# Patient Record
Sex: Female | Born: 1970 | Race: White | Hispanic: No | Marital: Single | Smoking: Never smoker
Health system: Southern US, Community
[De-identification: ages and names within clinical notes are randomized; demographics above are authoritative.]

## PROBLEM LIST (undated history)

## (undated) DIAGNOSIS — K828 Other specified diseases of gallbladder: Secondary | ICD-10-CM

## (undated) DIAGNOSIS — D242 Benign neoplasm of left breast: Secondary | ICD-10-CM

## (undated) DIAGNOSIS — N6012 Diffuse cystic mastopathy of left breast: Secondary | ICD-10-CM

## (undated) DIAGNOSIS — R079 Chest pain, unspecified: Secondary | ICD-10-CM

## (undated) DIAGNOSIS — R1011 Right upper quadrant pain: Secondary | ICD-10-CM

## (undated) HISTORY — PX: TUBAL LIGATION: SHX77

## (undated) HISTORY — DX: Chest pain, unspecified: R07.9

## (undated) HISTORY — PX: APPENDECTOMY: SHX54

## (undated) HISTORY — DX: Diffuse cystic mastopathy of left breast: N60.12

## (undated) HISTORY — PX: BREAST SURGERY: SHX581

## (undated) HISTORY — DX: Right upper quadrant pain: R10.11

## (undated) HISTORY — PX: TONSILLECTOMY: SUR1361

## (undated) HISTORY — PX: FOOT SURGERY: SHX648

## (undated) HISTORY — PX: CHOLECYSTECTOMY: SHX55

## (undated) HISTORY — PX: WISDOM TOOTH EXTRACTION: SHX21

## (undated) HISTORY — PX: HERNIA REPAIR: SHX51

## (undated) HISTORY — DX: Benign neoplasm of left breast: D24.2

## (undated) HISTORY — DX: Other specified diseases of gallbladder: K82.8

## (undated) HISTORY — PX: TOTAL ABDOMINAL HYSTERECTOMY: SHX209

---

## 2015-08-11 ENCOUNTER — Other Ambulatory Visit (HOSPITAL_COMMUNITY): Payer: Self-pay | Admitting: Surgery

## 2015-08-11 DIAGNOSIS — R1011 Right upper quadrant pain: Secondary | ICD-10-CM

## 2015-08-14 ENCOUNTER — Ambulatory Visit (HOSPITAL_COMMUNITY)
Admission: RE | Admit: 2015-08-14 | Discharge: 2015-08-14 | Disposition: A | Discharge status: Home / Self Care | Payer: PRIVATE HEALTH INSURANCE | Source: Ambulatory Visit | Attending: Surgery | Admitting: Surgery

## 2015-08-14 DIAGNOSIS — R1011 Right upper quadrant pain: Secondary | ICD-10-CM | POA: Diagnosis present

## 2015-08-14 MED ORDER — TECHNETIUM TC 99M MEBROFENIN IV KIT
5.0000 | PACK | Freq: Once | INTRAVENOUS | Status: AC | PRN
  Administered 2015-08-14: 5 via INTRAVENOUS

## 2015-08-17 DIAGNOSIS — R1011 Right upper quadrant pain: Secondary | ICD-10-CM | POA: Insufficient documentation

## 2015-08-17 HISTORY — DX: Right upper quadrant pain: R10.11

## 2016-04-29 DIAGNOSIS — D242 Benign neoplasm of left breast: Secondary | ICD-10-CM | POA: Insufficient documentation

## 2016-04-29 HISTORY — DX: Benign neoplasm of left breast: D24.2

## 2017-06-10 DIAGNOSIS — N6012 Diffuse cystic mastopathy of left breast: Secondary | ICD-10-CM | POA: Insufficient documentation

## 2017-06-10 HISTORY — DX: Diffuse cystic mastopathy of left breast: N60.12

## 2017-07-25 DIAGNOSIS — K828 Other specified diseases of gallbladder: Secondary | ICD-10-CM

## 2017-07-25 HISTORY — DX: Other specified diseases of gallbladder: K82.8

## 2018-03-18 DIAGNOSIS — R079 Chest pain, unspecified: Secondary | ICD-10-CM

## 2018-03-18 HISTORY — DX: Chest pain, unspecified: R07.9

## 2018-03-18 NOTE — Progress Notes (Signed)
Cardiology Office Note:    Date:  03/19/2018   ID:  Mackenzie Burns, DOB 1970/03/20, MRN [id number]  PCP:  Philmore Pali, NP  Cardiologist:  Shirlee More, MD   Referring MD: Philmore Pali, NP  ASSESSMENT:    1. Pre-procedure lab exam   2. Chest pain in adult    PLAN:    In order of problems listed above:  1. She is having symptoms best described as atypical angina.  There is an element of chest wall tenderness I will start her on a nonsteroidal anti-inflammatory drug and will do expedited cardiac CTA.  Unfortunately she has a history of urticaria with sulfa-based drugs cross reaction with Celebrex we will withdraw the prescription and place her on Tylenol 2 tabs twice daily pending results cardiac CTA  Next appointment 6 weeks   Medication Adjustments/Labs and Tests Ordered: Current medicines are reviewed at length with the patient today.  Concerns regarding medicines are outlined above.  Orders Placed This Encounter  Procedures  . CT CORONARY MORPH W/CTA COR W/SCORE W/CA W/CM &/OR WO/CM  . CT CORONARY FRACTIONAL FLOW RESERVE DATA PREP  . CT CORONARY FRACTIONAL FLOW RESERVE FLUID ANALYSIS  . EKG 12-Lead   No orders of the defined types were placed in this encounter.    Chief Complaint  Patient presents with  . Chest Pain    History of Present Illness:    Mackenzie Burns is a 48 y.o. female who is being seen today for the evaluation of chest pain at the request of Lam, Rudi Rummage, NP.  Recently seen at [facility] ED with normal troponin, D Dimer and EKG with non psecific changes and discharged.  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  She has a background history of previous chest pain greater than 20 years ago and 5 years ago a normal stress test.  New Year's Day she had the onset at rest with severe crushing substernal pressure rating to left arm she said initially heart rate was greater than 150 but no arrhythmia was cautioned on an EKG she was seen in the emergency room 2 EKG  shows sinus rhythm nonspecific T waves troponin was undetectable.  D-dimer was low chest x-ray unremarkable and was discharged.  In the emergency room she received nitroglycerin without relief morphine and subsequently Dilaudid oral and IV with some relief but was discharged in the hospital persistent chest pain she has no associated GI symptoms she has had some intermittent mild shortness of breath but not significant she is continued working continues to have these episodes nonexertional unrelieved with rest substernal pressure lasting for up to 20 minutes.  Her cardiovascular risk profile is intermediate with age and mild hyperlipidemia and family history of CAD in his father.  She has no known history of congenital rheumatic heart disease.  She has no associated GI symptoms no chest wall trauma but she is tender along the costochondral junction this may represent costochondritis for further evaluation cardiac CTA to be performed expedited and will place her on a nonsteroidal anti-inflammatory drug.  Follow-up anticipated in the office in 6 weeks.  Past Medical History:  Diagnosis Date  . Biliary dyskinesia 07/25/2017   Added automatically from request for surgery [id number]  . Chest pain in adult 03/18/2018  . Fibrocystic breast changes, left 06/10/2017  . 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 Right upper quadrant abdominal pain 08/17/2015    Past Surgical History:  Procedure Laterality Date  . APPENDECTOMY    .  BREAST SURGERY     papaloma removed  . CHOLECYSTECTOMY    . FOOT SURGERY    . HERNIA REPAIR    . TONSILLECTOMY    . TOTAL ABDOMINAL HYSTERECTOMY    . TUBAL LIGATION    . WISDOM TOOTH EXTRACTION      Current Medications: Current Meds  Medication Sig  . estradiol (ESTRACE) 2 MG tablet Take 2 mg by mouth daily.  . Multiple Vitamin (MULTIVITAMIN) capsule Take 1 capsule by mouth daily.  . Naproxen Sodium (ALEVE) 220 MG CAPS Take 2 capsules by mouth daily as needed.  .  Omega-3 1000 MG CAPS Take 1 g by mouth daily.     Allergies:   Tape; Codeine; Sulfamethoxazole; and Tramadol   Social History   Socioeconomic History  . Marital status: Single    Spouse name: Not on file  . Number of children: Not on file  . Years of education: Not on file  . Highest education level: Not on file  Occupational History  . Not on file  Social Needs  . Financial resource strain: Not on file  . Food insecurity:    Worry: Not on file    Inability: Not on file  . Transportation needs:    Medical: Not on file    Non-medical: Not on file  Tobacco Use  . Smoking status: Never Smoker  . Smokeless tobacco: Never Used  Substance and Sexual Activity  . Alcohol use: Not Currently  . Drug use: Never  . Sexual activity: Not on file  Lifestyle  . Physical activity:    Days per week: Not on file    Minutes per session: Not on file  . Stress: Not on file  Relationships  . Social connections:    Talks on phone: Not on file    Gets together: Not on file    Attends religious service: Not on file    Active member of club or organization: Not on file    Attends meetings of clubs or organizations: Not on file    Relationship status: Not on file  Other Topics Concern  . Not on file  Social History Narrative  . Not on file     Family History: The patient's family history includes Breast cancer in her sister; Cirrhosis in her mother; Diabetes in her mother; Heart attack in her father; Hyperlipidemia in her father.  ROS:   Review of Systems  Constitution: Negative.  HENT: Negative.   Eyes: Negative.   Cardiovascular: Positive for chest pain, dyspnea on exertion and palpitations.  Respiratory: Negative.   Endocrine: Negative.   Hematologic/Lymphatic: Negative.   Skin: Negative.   Musculoskeletal: Negative.   Gastrointestinal: Negative.   Genitourinary: Negative.   Neurological: Negative.   Psychiatric/Behavioral: Negative.   Allergic/Immunologic: Negative.     Please see the history of present illness.     All other systems reviewed and are negative.  EKGs/Labs/Other Studies Reviewed:    The following studies were reviewed today: Prior to the visit I reviewed the [facility] ED records including independent review of EKGs and lab studies renal function CBC were normal troponin undetectable d-dimer was quite low  EKG:  EKG is  ordered today.  The ekg ordered today demonstrates sinus rhythm normal  Recent Labs: No results found for requested labs within last 8760 hours.  Recent Lipid Panel No results found for: CHOL, TRIG, HDL, CHOLHDL, VLDL, LDLCALC, LDLDIRECT  Physical Exam:    VS:  BP 106/72 (BP Location: Left  Arm, Patient Position: Sitting, Cuff Size: Normal)   Pulse 79   Ht 5\' 6"  (1.676 m)   Wt 149 lb 12.8 oz (67.9 kg)   SpO2 98%   BMI 24.18 kg/m     Wt Readings from Last 3 Encounters:  03/19/18 149 lb 12.8 oz (67.9 kg)     GEN:  Well nourished, well developed in no acute distress HEENT: Normal NECK: No JVD; No carotid bruits LYMPHATICS: No lymphadenopathy CARDIAC: She has chest wall tenderness along the costochondral junction which somewhat reproduces but not completely her chest pain syndrome RRR, no murmurs, rubs, gallops RESPIRATORY:  Clear to auscultation without rales, wheezing or rhonchi  ABDOMEN: Soft, non-tender, non-distended MUSCULOSKELETAL:  No edema; No deformity  SKIN: Warm and dry NEUROLOGIC:  Alert and oriented x 3 PSYCHIATRIC:  Normal affect     Signed, Shirlee More, MD  03/19/2018 9:19 AM    [place]

## 2018-03-19 ENCOUNTER — Ambulatory Visit (INDEPENDENT_AMBULATORY_CARE_PROVIDER_SITE_OTHER): Payer: Commercial Managed Care - PPO | Admitting: Cardiology

## 2018-03-19 VITALS — BP 106/72 | HR 79 | Ht 66.0 in | Wt 149.8 lb

## 2018-03-19 DIAGNOSIS — R079 Chest pain, unspecified: Secondary | ICD-10-CM

## 2018-03-19 DIAGNOSIS — Z01812 Encounter for preprocedural laboratory examination: Secondary | ICD-10-CM

## 2018-03-19 MED ORDER — METOPROLOL TARTRATE 50 MG PO TABS: 50.0000 mg | ORAL_TABLET | Freq: Once | ORAL | 0 refills | Status: DC

## 2018-03-19 MED ORDER — ACETAMINOPHEN 325 MG PO TABS: 650.0000 mg | ORAL_TABLET | Freq: Two times a day (BID) | ORAL | Status: DC

## 2018-03-19 NOTE — Patient Instructions (Signed)
Medication Instructions:  Your physician has recommended you make the following change in your medication:  START acetaminophen (tylenol) 325 mg: Take 2 tablets (650 mg) twice daily   If you need a refill on your cardiac medications before your next appointment, please call your pharmacy.   Lab work: Your physician recommends that you return for lab work within 3-7 days before cardiac CTA: BMP. Please return to our office for lab work, no appointment needed. No need to fast beforehand.   If you have labs (blood work) drawn today and your tests are completely normal, you will receive your results only by: Marland Kitchen MyChart Message (if you have MyChart) OR . A paper copy in the mail If you have any lab test that is abnormal or we need to change your treatment, we will call you to review the results.  Testing/Procedures: You had an EKG today.   Your physician has requested that you have cardiac CT. Cardiac computed tomography (CT) is a painless test that uses an x-ray machine to take clear, detailed pictures of your heart. For further information please visit [employer]uge[employer]iest[employer]tn. Please follow instruction sheet as given.  Please arrive at the [facility] main entrance of [facility] at xx:xx AM (30-45 minutes prior to test start time)  [facility] [address], [place] [address] [phone]  Proceed to the [facility] Radiology Department (First Floor).  Please follow these instructions carefully (unless otherwise directed):  On the Night Before the Test: . Be sure to Drink plenty of water. . Do not consume any caffeinated/decaffeinated beverages or chocolate 12 hours prior to your test. . Do not take any antihistamines 12 hours prior to your test.  On the Day of the Test: . Drink plenty of water. Do not drink any water within one hour of the test. . Do not eat any food 4 hours prior to the test. . You may take your regular medications prior to the test.    . Take metoprolol (Lopressor) two hours prior to test.                 -If HR is less than 55 BPM- No Beta Blocker                -IF HR is greater than 55 BPM and patient is less than or equal to 7 yrs old Lopressor 100mg  x1.                -If HR is greater than 55 BPM and patient is greater than 32 yrs old Lopressor 50 mg x1.           After the Test: . Drink plenty of water. . After receiving IV contrast, you may experience a mild flushed feeling. This is normal. . On occasion, you may experience a mild rash up to 24 hours after the test. This is not dangerous. If this occurs, you can take Benadryl 25 mg and increase your fluid intake. . If you experience trouble breathing, this can be serious. If it is severe call 911 IMMEDIATELY. If it is mild, please call our office.   Follow-Up: At [facility], you and your health needs are our priority.  As part of our continuing mission to provide you with exceptional heart care, we have created designated Provider Care Teams.  These Care Teams include your primary Cardiologist (physician) and Advanced Practice Providers (APPs -  Physician Assistants and Nurse Practitioners) who all work together to  provide you with the care you need, when you need it. You will need a follow up appointment in 6 weeks.      Cardiac CT Angiogram  A cardiac CT angiogram is a procedure to look at the heart and the area around the heart. It may be done to help find the cause of chest pains or other symptoms of heart disease. During this procedure, a large X-ray machine, called a CT scanner, takes detailed pictures of the heart and the surrounding area after a dye (contrast material) has been injected into blood vessels in the area. The procedure is also sometimes called a coronary CT angiogram, coronary artery scanning, or CTA. A cardiac CT angiogram allows the health care provider to see how well blood is flowing to and from the heart. The health care provider  will be able to see if there are any problems, such as:  Blockage or narrowing of the coronary arteries in the heart.  Fluid around the heart.  Signs of weakness or disease in the muscles, valves, and tissues of the heart. Tell a health care provider about:  Any allergies you have. This is especially important if you have had a previous allergic reaction to contrast dye.  All medicines you are taking, including vitamins, herbs, eye drops, creams, and over-the-counter medicines.  Any blood disorders you have.  Any surgeries you have had.  Any medical conditions you have.  Whether you are pregnant or may be pregnant.  Any anxiety disorders, chronic pain, or other conditions you have that may increase your stress or prevent you from lying still. What are the risks? Generally, this is a safe procedure. However, problems may occur, including:  Bleeding.  Infection.  Allergic reactions to medicines or dyes.  Damage to other structures or organs.  Kidney damage from the dye or contrast that is used.  Increased risk of cancer from radiation exposure. This risk is low. Talk with your health care provider about: ? The risks and benefits of testing. ? How you can receive the lowest dose of radiation. What happens before the procedure?  Wear comfortable clothing and remove any jewelry, glasses, dentures, and hearing aids.  Follow instructions from your health care provider about eating and drinking. This may include: ? For 12 hours before the test - avoid caffeine. This includes tea, coffee, soda, energy drinks, and diet pills. Drink plenty of water or other fluids that do not have caffeine in them. Being well-hydrated can prevent complications. ? For 4-6 hours before the test - stop eating and drinking. The contrast dye can cause nausea, but this is less likely if your stomach is empty.  Ask your health care provider about changing or stopping your regular medicines. This is  especially important if you are taking diabetes medicines, blood thinners, or medicines to treat erectile dysfunction. What happens during the procedure?  Hair on your chest may need to be removed so that small sticky patches called electrodes can be placed on your chest. These will transmit information that helps to monitor your heart during the test.  An IV tube will be inserted into one of your veins.  You might be given a medicine to control your heart rate during the test. This will help to ensure that good images are obtained.  You will be asked to lie on an exam table. This table will slide in and out of the CT machine during the procedure.  Contrast dye will be injected into the IV tube.  You might feel warm, or you may get a metallic taste in your mouth.  You will be given a medicine (nitroglycerin) to relax (dilate) the arteries in your heart.  The table that you are lying on will move into the CT machine tunnel for the scan.  The person running the machine will give you instructions while the scans are being done. You may be asked to: ? Keep your arms above your head. ? Hold your breath. ? Stay very still, even if the table is moving.  When the scanning is complete, you will be moved out of the machine.  The IV tube will be removed. The procedure may vary among health care providers and hospitals. What happens after the procedure?  You might feel warm, or you may get a metallic taste in your mouth from the contrast dye.  You may have a headache from the nitroglycerin.  After the procedure, drink water or other fluids to wash (flush) the contrast material out of your body.  Contact a health care provider if you have any symptoms of allergy to the contrast. These symptoms include: ? Shortness of breath. ? Rash or hives. ? A racing heartbeat.  Most people can return to their normal activities right after the procedure. Ask your health care provider what activities are  safe for you.  It is up to you to get the results of your procedure. Ask your health care provider, or the department that is doing the procedure, when your results will be ready. Summary  A cardiac CT angiogram is a procedure to look at the heart and the area around the heart. It may be done to help find the cause of chest pains or other symptoms of heart disease.  During this procedure, a large X-ray machine, called a CT scanner, takes detailed pictures of the heart and the surrounding area after a dye (contrast material) has been injected into blood vessels in the area.  Ask your health care provider about changing or stopping your regular medicines before the procedure. This is especially important if you are taking diabetes medicines, blood thinners, or medicines to treat erectile dysfunction.  After the procedure, drink water or other fluids to wash (flush) the contrast material out of your body. This information is not intended to replace advice given to you by your health care provider. Make sure you discuss any questions you have with your health care provider. Document Released: 01/25/2008 Document Revised: 01/01/2016 Document Reviewed: 01/01/2016 Elsevier Interactive Patient Education  2019 [employer].

## 2018-03-24 ENCOUNTER — Telehealth: Payer: Self-pay

## 2018-03-24 DIAGNOSIS — R079 Chest pain, unspecified: Secondary | ICD-10-CM

## 2018-03-24 DIAGNOSIS — Z01812 Encounter for preprocedural laboratory examination: Secondary | ICD-10-CM

## 2018-03-24 NOTE — Telephone Encounter (Signed)
Spoke with patient's husband Richardson Chiquito to notify him that patient will need BMP prior to Cardiac CTA on 04-09-2018.  Orders entered.  Patient's husband verbalized understanding.

## 2018-04-04 LAB — BASIC METABOLIC PANEL
BUN/Creatinine Ratio: 16 (ref 9–23)
BUN: 9 mg/dL (ref 6–24)
CALCIUM: 9.2 mg/dL (ref 8.7–10.2)
CO2: 25 mmol/L (ref 20–29)
Chloride: 102 mmol/L (ref 96–106)
Creatinine, Ser: 0.56 mg/dL — ABNORMAL LOW (ref 0.57–1.00)
GFR calc Af Amer: 128 mL/min/{1.73_m2} (ref >59)
GFR calc non Af Amer: 111 mL/min/{1.73_m2} (ref >59)
Glucose: 95 mg/dL (ref 65–99)
Potassium: 3.8 mmol/L (ref 3.5–5.2)
Sodium: 140 mmol/L (ref 134–144)

## 2018-04-07 ENCOUNTER — Telehealth (HOSPITAL_COMMUNITY): Payer: Self-pay | Admitting: Emergency Medicine

## 2018-04-07 NOTE — Telephone Encounter (Signed)
Left message on voicemail with name and callback number Quavion Boule RN Navigator Cardiac Imaging [facility] [phone] Office [phone] Cell  

## 2018-04-09 ENCOUNTER — Ambulatory Visit (HOSPITAL_COMMUNITY)
Admission: RE | Admit: 2018-04-09 | Discharge: 2018-04-09 | Disposition: A | Discharge status: Home / Self Care | Payer: Commercial Managed Care - PPO | Source: Ambulatory Visit | Attending: Cardiology | Admitting: Cardiology

## 2018-04-09 ENCOUNTER — Encounter (HOSPITAL_COMMUNITY): Payer: Self-pay

## 2018-04-09 ENCOUNTER — Ambulatory Visit (HOSPITAL_COMMUNITY): Admission: RE | Admit: 2018-04-09 | Payer: Commercial Managed Care - PPO | Source: Ambulatory Visit

## 2018-04-09 DIAGNOSIS — R079 Chest pain, unspecified: Secondary | ICD-10-CM

## 2018-04-09 MED ORDER — METOPROLOL TARTRATE 5 MG/5ML IV SOLN
INTRAVENOUS | Status: AC
  Filled 2018-04-09: qty 5

## 2018-04-09 MED ORDER — IOPAMIDOL (ISOVUE-370) INJECTION 76%
80.0000 mL | Freq: Once | INTRAVENOUS | Status: AC | PRN
  Administered 2018-04-09: 80 mL via INTRAVENOUS

## 2018-04-09 MED ORDER — NITROGLYCERIN 0.4 MG SL SUBL
0.8000 mg | SUBLINGUAL_TABLET | Freq: Once | SUBLINGUAL | Status: AC
  Administered 2018-04-09: 0.8 mg via SUBLINGUAL
  Filled 2018-04-09: qty 25

## 2018-04-09 MED ORDER — NITROGLYCERIN 0.4 MG SL SUBL
SUBLINGUAL_TABLET | SUBLINGUAL | Status: AC
  Filled 2018-04-09: qty 2

## 2018-04-09 MED ORDER — METOPROLOL TARTRATE 5 MG/5ML IV SOLN
5.0000 mg | INTRAVENOUS | Status: DC | PRN
  Administered 2018-04-09: 5 mg via INTRAVENOUS
  Filled 2018-04-09: qty 5

## 2018-04-09 NOTE — Progress Notes (Signed)
Patient tolerated CT without incident. Drank coke did not want anything to eat. Ambulated to exit steady gait.

## 2018-04-29 NOTE — Progress Notes (Signed)
Cardiology Office Note:    Date:  04/30/2018   ID:  Mackenzie Burns, DOB 1970/03/21, MRN [id number]  PCP:  Philmore Pali, NP  Cardiologist:  Shirlee More, MD    Referring MD: Philmore Pali, NP    ASSESSMENT:    1. Costochondral chest pain    PLAN:    In order of problems listed above:  1. Persistent symptoms discontinue over-the-counter start prescription strength nonsteroidal if unimproved proved refer to physical therapy modalities   Next appointment: As needed   Medication Adjustments/Labs and Tests Ordered: Current medicines are reviewed at length with the patient today.  Concerns regarding medicines are outlined above.  No orders of the defined types were placed in this encounter.  No orders of the defined types were placed in this encounter.   Chief Complaint  Patient presents with  . Chest Pain    History of Present Illness:    Mackenzie Burns is a 48 y.o. female with a hx of chest pain seen 03/19/2018 with a clinical diagnosis of chronic costchondritis and referred for cardiac CTA.  This was quite reassuring with a calcium score of 0 normal coronary origin no coronary atherosclerosis or CAD noted.  The over read a full chest CT was normal Compliance with diet, lifestyle and medications: Yes Unfortunately she continues to have chest wall pain reproducible on physical examination despite over-the-counter Aleve.  We discussed treatment modalities and prior to referral to physical therapy would be very difficult lifestyle of working full-time and being in school we will switch to prescription strength nonsteroidal if unimproved she will call me I will refer for PT modalities of iontophoresis high potency steroid.  With normal cardiac CTA we do not require any further evaluation for CAD.  No edema orthopnea or syncope.  Past Medical History:  Diagnosis Date  . Biliary dyskinesia 07/25/2017   Added automatically from request for surgery [id number]  . Chest pain in adult  03/18/2018  . Fibrocystic breast changes, left 06/10/2017  . 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 Right upper quadrant abdominal pain 08/17/2015    Past Surgical History:  Procedure Laterality Date  . APPENDECTOMY    . BREAST SURGERY     papaloma removed  . CHOLECYSTECTOMY    . FOOT SURGERY    . HERNIA REPAIR    . TONSILLECTOMY    . TOTAL ABDOMINAL HYSTERECTOMY    . TUBAL LIGATION    . WISDOM TOOTH EXTRACTION      Current Medications: Current Meds  Medication Sig  . estradiol (ESTRACE) 2 MG tablet Take 2 mg by mouth daily.  . Multiple Vitamin (MULTIVITAMIN) capsule Take 1 capsule by mouth daily.  . Naproxen Sodium (ALEVE) 220 MG CAPS Take 2 capsules by mouth daily as needed.  . Omega-3 1000 MG CAPS Take 1 g by mouth daily.     Allergies:   Tape; Codeine; Sulfamethoxazole; and Tramadol   Social History   Socioeconomic History  . Marital status: Single    Spouse name: Not on file  . Number of children: Not on file  . Years of education: Not on file  . Highest education level: Not on file  Occupational History  . Not on file  Social Needs  . Financial resource strain: Not on file  . Food insecurity:    Worry: Not on file    Inability: Not on file  . Transportation needs:    Medical: Not on file    Non-medical: Not on file  Tobacco Use  . Smoking status: Never Smoker  . Smokeless tobacco: Never Used  Substance and Sexual Activity  . Alcohol use: Not Currently  . Drug use: Never  . Sexual activity: Not on file  Lifestyle  . Physical activity:    Days per week: Not on file    Minutes per session: Not on file  . Stress: Not on file  Relationships  . Social connections:    Talks on phone: Not on file    Gets together: Not on file    Attends religious service: Not on file    Active member of club or organization: Not on file    Attends meetings of clubs or organizations: Not on file    Relationship status: Not on file  Other Topics Concern  . Not  on file  Social History Narrative  . Not on file     Family History: The patient's family history includes Breast cancer in her sister; Cirrhosis in her mother; Diabetes in her mother; Heart attack in her father; Hyperlipidemia in her father. ROS:   Please see the history of present illness.    All other systems reviewed and are negative.  EKGs/Labs/Other Studies Reviewed:    The following studies were reviewed today:   Recent Labs: 04/03/2018: BUN 9; Creatinine, Ser 0.56; Potassium 3.8; Sodium 140  Recent Lipid Panel No results found for: CHOL, TRIG, HDL, CHOLHDL, VLDL, LDLCALC, LDLDIRECT  Physical Exam:    VS:  BP 94/68 (BP Location: Right Arm, Patient Position: Sitting, Cuff Size: Normal)   Pulse 91   Ht 5' 6.5" (1.689 m)   Wt 149 lb 3.2 oz (67.7 kg)   SpO2 98%   BMI 23.72 kg/m     Wt Readings from Last 3 Encounters:  04/30/18 149 lb 3.2 oz (67.7 kg)  03/19/18 149 lb 12.8 oz (67.9 kg)     GEN:  Well nourished, well developed in no acute distress HEENT: Normal NECK: No JVD; No carotid bruits LYMPHATICS: No lymphadenopathy CARDIAC: Tenderness costochondral junction left side reproduces her symptoms RRR, no murmurs, rubs, gallops RESPIRATORY:  Clear to auscultation without rales, wheezing or rhonchi  ABDOMEN: Soft, non-tender, non-distended MUSCULOSKELETAL:  No edema; No deformity  SKIN: Warm and dry NEUROLOGIC:  Alert and oriented x 3 PSYCHIATRIC:  Normal affect    Signed, Shirlee More, MD  04/30/2018 8:41 AM    [place]

## 2018-04-30 ENCOUNTER — Ambulatory Visit (INDEPENDENT_AMBULATORY_CARE_PROVIDER_SITE_OTHER): Payer: Commercial Managed Care - PPO | Admitting: Cardiology

## 2018-04-30 ENCOUNTER — Encounter: Payer: Self-pay | Admitting: Cardiology

## 2018-04-30 VITALS — BP 94/68 | HR 91 | Ht 66.5 in | Wt 149.2 lb

## 2018-04-30 DIAGNOSIS — R0789 Other chest pain: Secondary | ICD-10-CM

## 2018-04-30 DIAGNOSIS — R071 Chest pain on breathing: Secondary | ICD-10-CM | POA: Diagnosis not present

## 2018-04-30 MED ORDER — NABUMETONE 750 MG PO TABS: ORAL_TABLET | ORAL | 1 refills | Status: DC

## 2018-04-30 NOTE — Patient Instructions (Signed)
Medication Instructions:  Your physician has recommended you make the following change in your medication:   STOP aleve  START nabumetone (relafen) 750 mg: Take 1 tablet daily for 2 weeks then take 1 tablet daily as needed  If you need a refill on your cardiac medications before your next appointment, please call your pharmacy.   Lab work: None  If you have labs (blood work) drawn today and your tests are completely normal, you will receive your results only by: Marland Kitchen MyChart Message (if you have MyChart) OR . A paper copy in the mail If you have any lab test that is abnormal or we need to change your treatment, we will call you to review the results.  Testing/Procedures: None  Follow-Up: At [facility], you and your health needs are our priority.  As part of our continuing mission to provide you with exceptional heart care, we have created designated Provider Care Teams.  These Care Teams include your primary Cardiologist (physician) and Advanced Practice Providers (APPs -  Physician Assistants and Nurse Practitioners) who all work together to provide you with the care you need, when you need it. You will need a follow up appointment as needed if symptoms worsen or fail to improve.     Nabumetone tablets What is this medicine? NABUMETONE (na BYOO me tone) is a non-steroidal anti-inflammatory drug (NSAID). It is used to reduce swelling and to treat pain. It is used for osteoarthritis or rheumatoid arthritis. This medicine may be used for other purposes; ask your health care provider or pharmacist if you have questions. COMMON BRAND NAME(S): Relafen What should I tell my health care provider before I take this medicine? They need to know if you have any of these conditions: -cigarette smoker -coronary artery bypass graft (CABG) surgery within the past 2 weeks -drink more than 3 alcohol-containing drinks a day -heart disease -high blood pressure -history of stomach  bleeding -kidney disease -liver disease -lung or breathing disease, like asthma -an unusual or allergic reaction to nabumetone, aspirin, other NSAIDs, other medicines, foods, dyes, or preservatives -pregnant or trying to get pregnant -breast-feeding How should I use this medicine? Take this medicine by mouth with a full glass of water. Follow the directions on the prescription label. You can take it with or without food. If it upsets your stomach, take it with food. Try to not lie down for at least 10 minutes after you take this medicine. Take your medicine at regular intervals. Do not take your medicine more often than directed. Long term, continuous use may increase the risk of heart attack or stroke. A special MedGuide will be given to you by the pharmacist with each prescription and refill. Be sure to read this information carefully each time. Talk to your pediatrician regarding the use of this medicine in children. Special care may be needed. Overdosage: If you think you have taken too much of this medicine contact a poison control center or emergency room at once. NOTE: This medicine is only for you. Do not share this medicine with others. What if I miss a dose? If you miss a dose, take it as soon as you can. If it is almost time for your next dose, take only that dose. Do not take double or extra doses. What may interact with this medicine? -alcohol -aspirin -cidofovir -diuretics -lithium -medicines for high blood pressure -methotrexate -other drugs for inflammation like ketorolac, ibuprofen, and prednisone -pemetrexed -warfarin This list may not describe all possible interactions. Give  your health care provider a list of all the medicines, herbs, non-prescription drugs, or dietary supplements you use. Also tell them if you smoke, drink alcohol, or use illegal drugs. Some items may interact with your medicine. What should I watch for while using this medicine? Tell your doctor or  health care professional if your pain does not get better. Talk to your doctor before taking another medicine for pain. Do not treat yourself. This medicine does not prevent heart attack or stroke. In fact, this medicine may increase the chance of a heart attack or stroke. The chance may increase with longer use of this medicine and in people who have heart disease. If you take aspirin to prevent heart attack or stroke, talk with your doctor or health care professional. Do not take medicines such as ibuprofen and naproxen with this medicine. Side effects such as stomach upset, nausea, or ulcers may be more likely to occur. Many medicines available without a prescription should not be taken with this medicine. This medicine can cause ulcers and bleeding in the stomach and intestines at any time during treatment. Do not smoke cigarettes or drink alcohol. These increase irritation to your stomach and can make it more susceptible to damage from this medicine. Ulcers and bleeding can happen without warning symptoms and can cause death. You may get drowsy or dizzy. Do not drive, use machinery, or do anything that needs mental alertness until you know how this medicine affects you. Do not stand or sit up quickly, especially if you are an older patient. This reduces the risk of dizzy or fainting spells. This medicine can cause you to bleed more easily. Try to avoid damage to your teeth and gums when you brush or floss your teeth. What side effects may I notice from receiving this medicine? Side effects that you should report to your doctor or health care professional as soon as possible: -black or bloody stools, blood in the urine or vomit -blurred vision -chest pain -difficulty breathing or wheezing -nausea or vomiting -skin rash, skin redness, blistering or peeling skin, hives, or itching -slurred speech or weakness on one side of the body -severe stomach pain -swelling of eyelids, throat,  lips -unexplained weight gain or swelling -unusually weak or tired -yellowing of eyes or skin Side effects that usually do not require medical attention (report to your doctor or health care professional if they continue or are bothersome): -constipation or diarrhea -gas or heartburn This list may not describe all possible side effects. Call your doctor for medical advice about side effects. You may report side effects to FDA at 1-800-FDA-1088. Where should I keep my medicine? Keep out of the reach of children. Store at room temperature between 15 and 30 degrees C (59 and 86 degrees F). Keep container tightly closed. Throw away any unused medicine after the expiration date. NOTE: This sheet is a summary. It may not cover all possible information. If you have questions about this medicine, talk to your doctor, pharmacist, or health care provider.  2019 Elsevier/Gold Standard (2016-10-16 15:43:10)

## 2018-12-09 ENCOUNTER — Other Ambulatory Visit: Payer: Self-pay

## 2018-12-10 NOTE — Progress Notes (Signed)
Name: Mackenzie Burns  MRN/ DOB: [id number], 04-Jun-1970    Age/ Sex: 48 y.o., female    PCP: Philmore Pali, NP   Reason for Endocrinology Evaluation: Thyroid Nodule      Date of Initial Endocrinology Evaluation: 12/11/2018     HPI: Mackenzie Burns is a 48 y.o. female with a past medical history of goiter. The patient presented for initial endocrinology clinic visit on 12/11/2018 for consultative assistance with her right thyroid nodule .    Pt has been diagnosed with MNG since 2015.  Pt noted dysphagia and throat tightness around 09/2018 which prompted a repeat thyroid ultrasound.   She is S/P FNA of the right 11/03/2018  Has chronic GERD and sore throat.  No prior exposure to radiation  Younger  Brother with hyperthyroidism    Father had a goiter.   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 Biliary dyskinesia 07/25/2017   Added automatically from request for surgery [phone]  . Chest pain in adult 03/18/2018  . Fibrocystic breast changes, left 06/10/2017  . 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 Right upper quadrant abdominal pain 08/17/2015   Past Surgical History:  Past Surgical History:  Procedure Laterality Date  . APPENDECTOMY    . BREAST SURGERY     papaloma removed  . CHOLECYSTECTOMY    . FOOT SURGERY    . HERNIA REPAIR    . TONSILLECTOMY    . TOTAL ABDOMINAL HYSTERECTOMY    . TUBAL LIGATION    . WISDOM TOOTH EXTRACTION        Social History:  reports that she has never smoked. She has never used smokeless tobacco. She reports previous alcohol use. She reports that she does not use drugs.  Family History: family history includes Breast cancer in her sister; Cirrhosis in her mother; Diabetes in her mother; Heart attack in her father; Hyperlipidemia in her father.   HOME MEDICATIONS: Allergies as of 12/11/2018      Reactions   Tape Other (See Comments)   Plastic tape & tegaderm - breaks skin out (paper tape ok)   Codeine Nausea And Vomiting   Sulfamethoxazole Hives, Rash   Tramadol Nausea And Vomiting      Medication List       Accurate as of December 11, 2018  8:50 AM. If you have any questions, ask your nurse or doctor.        atorvastatin 10 MG tablet Commonly known as: LIPITOR Take 10 mg by mouth at bedtime.   estradiol 2 MG tablet Commonly known as: ESTRACE Take 2 mg by mouth daily.   Fenofibric Acid 135 MG Cpdr Take 1 capsule by mouth daily.   multivitamin capsule Take 1 capsule by mouth daily.   nabumetone 750 MG tablet Commonly known as: RELAFEN Take 1 tablet (750 mg) by mouth daily for 2 weeks then take 1 tablet daily as needed.   Omega-3 1000 MG Caps Take 1 g by mouth daily.         REVIEW OF SYSTEMS: A comprehensive ROS was conducted with the patient and is negative except as per HPI and below:  Review of Systems  Constitutional: Negative for fever and weight loss.  HENT: Positive for congestion. Negative for sore throat.   Respiratory: Negative for cough and shortness of breath.   Cardiovascular: Negative for chest pain and palpitations.  Gastrointestinal: Negative for constipation and diarrhea.  Psychiatric/Behavioral: Negative for depression. The patient is not nervous/anxious.        OBJECTIVE:  VS: BP 124/68 (BP Location: Right Arm, Patient Position: Sitting, Cuff Size: Normal)   Pulse 75   Temp 98 F (36.7 C)   Ht 5\' 5"  (1.651 m)   Wt 155 lb 3.2 oz (70.4 kg)   SpO2 98%   BMI 25.83 kg/m    Wt Readings from Last 3 Encounters:  12/11/18 155 lb 3.2 oz (70.4 kg)  04/30/18 149 lb 3.2 oz (67.7 kg)  03/19/18 149 lb 12.8 oz (67.9 kg)     EXAM: General: Pt appears well and is in NAD  Hydration: Well-hydrated with moist mucous membranes and good skin turgor  Eyes: External eye exam normal without stare, lid lag or exophthalmos.  EOM intact.  PERRL.  Ears, Nose, Throat: Hearing: Grossly  intact bilaterally Dental: Good dentition  Throat: Clear without mass, erythema or exudate  Neck: General: Supple without adenopathy. Thyroid: Thyroid size normal.  No goiter or nodules appreciated. No thyroid bruit.  Lungs: Clear with good BS bilat with no rales, rhonchi, or wheezes  Heart: Auscultation: RRR.  Abdomen: Normoactive bowel sounds, soft, nontender, without masses or organomegaly palpable  Extremities: Gait and station: Normal gait  Digits and nails: No clubbing, cyanosis, petechiae, or nodes Head and neck: Normal alignment and mobility BL UE: Normal ROM and strength. BL LE: No pretibial edema normal ROM and strength.  Skin: Hair: Texture and amount normal with gender appropriate distribution Skin Inspection: No rashes, acanthosis nigricans/skin tags. No lipohypertrophy Skin Palpation: Skin temperature, texture, and thickness normal to palpation  Neuro: Cranial nerves: II - XII grossly intact  Cerebellar: Normal coordination and movement; no tremor Motor: Normal strength throughout DTRs: 2+ and symmetric in UE without delay in relaxation phase  Mental Status: Judgment, insight: Intact Orientation: Oriented to time, place, and person Memory: Intact for recent and remote events Mood and affect: No depression, anxiety, or agitation     DATA REVIEWED: 10/06/2018  TSH 1.10 uIU/mL    Thyroid Ultrasound 11/03/2018 Multiple subcentimeter nodules, dominant superior right nodule 1.4 cm (previously 1.2 cm )   ASSESSMENT/PLAN/RECOMMENDATIONS:   1. MNG :  - Pt with some local neck symptoms that are more related to heartburn/GI issues  rather then right thyroid nodule.  - Clinically and bio chemically euthyroid  - She is S/P FNA of the right thyroid nodule on 11/03/2018, cytology report unavailable.  - Pt tells me she was told this was an inconclusive report, I am not clear what that means exactly, this could mean they didn't have sufficient cells , which means she will need  repeat FNA in 01/2019 or it could also mean that she has sufficient cells but some abnormal features of undermined significance and in the event will need to wait on Afirma.  - Pt will sign a ROI and will be faxed to [facility].    F/U in 6 months   Signed electronically by: Mack Guise, MD  [facility]  [facility] [address] [place],  [address] Phone: [phone] FAX: [phone]   CC: Philmore Pali, NP [place] [address] Phone: [phone] Fax: [phone]   Return to Endocrinology clinic as below: No future appointments.

## 2018-12-11 ENCOUNTER — Encounter: Payer: Self-pay | Admitting: Internal Medicine

## 2018-12-11 ENCOUNTER — Ambulatory Visit: Payer: Commercial Managed Care - PPO | Admitting: Internal Medicine

## 2018-12-11 ENCOUNTER — Other Ambulatory Visit: Payer: Self-pay

## 2018-12-11 VITALS — BP 124/68 | HR 75 | Temp 98.0°F | Ht 65.0 in | Wt 155.2 lb

## 2018-12-11 DIAGNOSIS — E042 Nontoxic multinodular goiter: Secondary | ICD-10-CM | POA: Insufficient documentation

## 2018-12-11 NOTE — Patient Instructions (Signed)
-   Will obtain your cytology report before making a final determination of the final step.

## 2019-06-07 ENCOUNTER — Encounter: Payer: Self-pay | Admitting: Internal Medicine

## 2019-06-15 ENCOUNTER — Ambulatory Visit: Payer: Commercial Managed Care - PPO | Admitting: Internal Medicine

## 2019-06-29 NOTE — Progress Notes (Signed)
Name: Mackenzie Burns  MRN/ DOB: [id number], 05/05/1970    Age/ Sex: 49 y.o., female     PCP: Philmore Pali, NP   Reason for Endocrinology Evaluation: Thyroid Nodule     Initial Endocrinology Clinic Visit: 12/11/2018    PATIENT IDENTIFIER: Mackenzie Burns is a 49 y.o., female with a past medical history of Thyromegaly. She has followed with [facility] since 12/11/2018 for consultative assistance with management of her Right thyroid nodule.   HISTORICAL SUMMARY: The patient was first diagnosed with MNG in 2015. She is S/P FNA of the right thyroid nodule on 11/03/2018   Younger  Brother with hyperthyroidism  Father had a goiter.   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   Today (06/30/2019):  Mackenzie Burns is here for a follow up on right thyroid nodule. She had a repeat FNA of the right thyroid nodule in 04/2019 as the previous report was lost ( Apparently showed Atypia ) Thyroseq came back negative   She denies any change in BM's No palpitations Has not noted any local neck enlargement but continues to have a "stuck up feeling" in her throat    ROS:  As per HPI.   HISTORY:  Past Medical History:  Past Medical History:  Diagnosis Date  . Biliary dyskinesia 07/25/2017   Added automatically from request for surgery [id number]  . Chest pain in adult 03/18/2018  . Fibrocystic breast changes, left 06/10/2017  . 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 Right upper quadrant abdominal pain 08/17/2015   Past Surgical History:  Past Surgical History:  Procedure Laterality Date  . APPENDECTOMY    . BREAST SURGERY     papaloma removed  . CHOLECYSTECTOMY    . FOOT SURGERY    . HERNIA REPAIR    . TONSILLECTOMY    . TOTAL ABDOMINAL HYSTERECTOMY    . TUBAL LIGATION    . WISDOM TOOTH EXTRACTION      Social History:  reports that she has never smoked.  She has never used smokeless tobacco. She reports previous alcohol use. She reports that she does not use drugs. Family History:  Family History  Problem Relation Age of Onset  . Cirrhosis Mother   . Diabetes Mother   . Heart attack Father   . Hyperlipidemia Father   . Breast cancer Sister      HOME MEDICATIONS: Allergies as of 06/30/2019      Reactions   Tape Other (See Comments)   Plastic tape & tegaderm - breaks skin out (paper tape ok)   Codeine Nausea And Vomiting   Sulfamethoxazole Hives, Rash   Tramadol Nausea And Vomiting      Medication List       Accurate as of Jun 30, 2019  9:47 AM. If you have any questions, ask your nurse or doctor.        atorvastatin 10 MG tablet Commonly known as: LIPITOR Take 10 mg by mouth at bedtime.   butalbital-acetaminophen-caffeine 50-325-40 MG tablet Commonly known as: FIORICET Take by mouth 2 (two) times daily as needed for headache.   estradiol 2 MG tablet Commonly known as: ESTRACE Take 2 mg by mouth daily.   Fenofibric Acid 135 MG Cpdr Take 1 capsule by mouth daily.   meloxicam 15 MG tablet Commonly known as: MOBIC Take 15 mg by mouth daily.  multivitamin capsule Take 1 capsule by mouth daily.   nabumetone 750 MG tablet Commonly known as: RELAFEN Take 1 tablet (750 mg) by mouth daily for 2 weeks then take 1 tablet daily as needed.   Omega-3 1000 MG Caps Take 1 g by mouth daily.   Percocet 5-325 MG tablet Generic drug: oxyCODONE-acetaminophen 1 EACH PO TID         OBJECTIVE:   PHYSICAL EXAM: VS: BP 102/62 (BP Location: Left Arm, Patient Position: Sitting, Cuff Size: Large)   Pulse 84   Temp 97.7 F (36.5 C)   Ht 5\' 5"  (1.651 m)   Wt 151 lb (68.5 kg)   SpO2 98%   BMI 25.13 kg/m    EXAM: General: Pt appears well and is in NAD  Neck: General: Supple without adenopathy. Thyroid: Thyroid size normal.  Right  nodule appreciated.  Lungs: Clear with good BS bilat with no rales, rhonchi, or wheezes    Heart: Auscultation: RRR.  Abdomen: Normoactive bowel sounds, soft, nontender, without masses or organomegaly palpable  Extremities:  BL LE: No pretibial edema normal ROM and strength.  Mental Status: Judgment, insight: Intact Mood and affect: No depression, anxiety, or agitation     DATA REVIEWED:  05/19/2019 TSH 1.03 uIU/Ml     Thyroid Ultrasound 11/03/2018 Multiple subcentimeter nodules, dominant superior right nodule 1.4 cm (previously 1.2 cm )          ASSESSMENT / PLAN / RECOMMENDATIONS:   1. Multinodular Goiter:   - Minimal local neck symptoms  - She had FNA of the right superior 1.2 cm  nodule on 10/29/2018 with Atypia ( no official report) Apparently molecular testing results were lost and FNA had to be repeated on 06/07/2019 , cytology report not available but Molecular testing Tyna Jaksch) was negative - Reassurance provided at this time, will repeat thyroid ultrasound in a year   F/U in 1 yr    Signed electronically by: Mack Guise, MD  [facility]  [address] [facility] [address] [place], [place] [address] Phone: [phone] FAX: [phone]      CC: Philmore Pali, NP [place] [address] Phone: [phone]  Fax: [phone]   Return to Endocrinology clinic as below: Future Appointments  Date Time Provider [place]  06/29/2020  9:10 AM Riana Tessmer, Melanie Crazier, MD [id number] None

## 2019-06-30 ENCOUNTER — Ambulatory Visit: Payer: Commercial Managed Care - PPO | Admitting: Internal Medicine

## 2019-06-30 ENCOUNTER — Other Ambulatory Visit: Payer: Self-pay

## 2019-06-30 ENCOUNTER — Encounter: Payer: Self-pay | Admitting: Internal Medicine

## 2019-06-30 VITALS — BP 102/62 | HR 84 | Temp 97.7°F | Ht 65.0 in | Wt 151.0 lb

## 2019-06-30 DIAGNOSIS — E042 Nontoxic multinodular goiter: Secondary | ICD-10-CM | POA: Diagnosis not present

## 2019-06-30 NOTE — Patient Instructions (Signed)
-   A thyroid ultrasound has been entered for next year to be done at [facility]

## 2019-07-30 ENCOUNTER — Other Ambulatory Visit: Payer: Commercial Managed Care - PPO

## 2019-08-19 DIAGNOSIS — R7989 Other specified abnormal findings of blood chemistry: Secondary | ICD-10-CM

## 2019-08-19 DIAGNOSIS — E875 Hyperkalemia: Secondary | ICD-10-CM

## 2019-08-19 DIAGNOSIS — R079 Chest pain, unspecified: Secondary | ICD-10-CM

## 2019-08-20 DIAGNOSIS — E875 Hyperkalemia: Secondary | ICD-10-CM | POA: Diagnosis not present

## 2019-08-20 DIAGNOSIS — R079 Chest pain, unspecified: Secondary | ICD-10-CM

## 2019-08-20 DIAGNOSIS — R7989 Other specified abnormal findings of blood chemistry: Secondary | ICD-10-CM | POA: Diagnosis not present

## 2020-06-06 ENCOUNTER — Telehealth: Payer: Self-pay | Admitting: Neurology

## 2020-06-06 ENCOUNTER — Ambulatory Visit: Payer: Commercial Managed Care - PPO | Admitting: Neurology

## 2020-06-06 ENCOUNTER — Encounter: Payer: Self-pay | Admitting: Neurology

## 2020-06-06 VITALS — BP 117/72 | HR 77 | Ht 66.5 in | Wt 148.3 lb

## 2020-06-06 DIAGNOSIS — R519 Headache, unspecified: Secondary | ICD-10-CM

## 2020-06-06 DIAGNOSIS — G43019 Migraine without aura, intractable, without status migrainosus: Secondary | ICD-10-CM

## 2020-06-06 DIAGNOSIS — Z789 Other specified health status: Secondary | ICD-10-CM | POA: Diagnosis not present

## 2020-06-06 DIAGNOSIS — R351 Nocturia: Secondary | ICD-10-CM

## 2020-06-06 DIAGNOSIS — R0683 Snoring: Secondary | ICD-10-CM | POA: Diagnosis not present

## 2020-06-06 MED ORDER — TOPIRAMATE 50 MG PO TABS: 75.0000 mg | ORAL_TABLET | Freq: Every day | ORAL | 3 refills | Status: DC

## 2020-06-06 NOTE — Patient Instructions (Addendum)
It was nice to meet you today.  You have a history of migraines, your chronic, nearly daily headache may be from a combination of things including migraine headaches, using caffeine in excess on a day-to-day basis and not hydrating well enough with water.  These are all contributors.  In addition, underlying sleep apnea is a possibility as well, untreated sleep apnea can cause morning headaches.  You have not had an updated eye exam in nearly 2 years.  Please schedule with a optometrist or ophthalmologist of your choosing for a more detailed and complete eye exam.  You may need an updated prescription for your eyeglasses, which can help with headaches as well.  Please try to hydrate better with water, 6 to 8 cups of water per day are generally recommended, 8 ounce size each.  Please reduce and limit your caffeine to 1-3 servings per day on average, 8 ounce size each.  Try to get enough rest, 7 to 8 hours of sleep are generally recommended.  Your neurological exam is normal thankfully.  Nevertheless, due to increase in your headaches in the past 3 months I would like to exclude a structural cause of your symptoms.  I will order a brain MRI with and without contrast.  In addition, we will proceed with a home sleep test to check for sleep apnea.  We will call you with your results of your brain MRI and sleep test.  If you have sleep apnea, we will consider treatment with an AutoPap or CPAP machine.  We will keep you posted.  For now, we will increase the Topamax to 75 mg at bedtime.  I have adjusted your prescription.  Please follow-up routinely to see one of our nurse practitioners in 3 months.

## 2020-06-06 NOTE — Progress Notes (Signed)
Subjective:    Patient ID: Mackenzie Burns is a 50 y.o. female.  HPI     Star Age, MD, PhD [facility] [address], [address] [place], [place] [address]  Dear [name],   I saw your patient, Mackenzie Burns, upon your kind request, in my Neurologic clinic today for initial consultation of her headaches, concern for migraines.  The patient is unaccompanied today.  As you know, Mackenzie Burns is a 50 year old right-handed woman with an underlying medical history of thyroid nodule, hyperlipidemia, back pain, shoulder pain on the right, fibrocystic disease of the breast, and biliary dyskinesia, who reports a history of migraines for about 20 years.  She has previously never seen a neurologist but has tried multiple medications.  In the past 3 months her headaches are more daily, constant, and global.  Migraine attacks have been throbbing and one-sided in the past.  She has had visual auras but not recently.  She currently reports a constant and nearly daily headache.  She has had nausea, no vomiting.  She denies any neurological accompaniments such as numbness or tingling or droopy face or slurring of speech, never had any sudden onset of neurological symptoms.  She had a recent follow-up appointment and blood work through your office on 05/22/2020, we will request test results.  She was also advised to increase her Topamax to 50 mg at bedtime.  She tolerates that, she has not had any telltale improvement yet to her headache.  She reports a headache of 10 out of 10 today. I reviewed your office note from 04/28/2020.  She has tried several preventative and acute medications including over-the-counter nonsteroidal anti-inflammatory medications, acetaminophen, Nurtec, Ubrelvy, and sumatriptan hand, rizatriptan and Fioricet.  She has been on amitriptyline more recently which made her sleepy at 25 mg strength.  In early March she was started on low-dose topiramate 25 mg at bedtime.   She has had light sensitivity, she does not typically have sound sensitivity.  She reports that chronic back pain has exacerbated her headaches.  She has seen neurosurgery for her back in the past and has scoliosis.  She has tried Percocet in the past which helped her back pain and also her headaches.  She has tried tramadol which caused nausea and vomiting.  She sleeps fairly well, bedtime is around 10 and rise time around 530, she has nocturia about once or twice per average night, she suspects that her father has sleep apnea.  She has never had a sleep study.  She snores some.  She drinks caffeine in the form of a, 3-4 bottles per day on average, she does not typically drink any water.  She does not drink alcohol, she quit smoking in 2007.  She had a tonsillectomy in 1999.  She had an MRI some 15 years ago and it was reportedly normal at the time.  Her Epworth sleepiness score is 2 out of 24.  Her Past Medical History Is Significant For: Past Medical History:  Diagnosis Date  . Biliary dyskinesia 07/25/2017   Added automatically from request for surgery [phone]  . Chest pain in adult 03/18/2018  . Fibrocystic breast changes, left 06/10/2017  . 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 Right upper quadrant abdominal pain 08/17/2015    Her Past Surgical History Is Significant For: Past Surgical History:  Procedure Laterality Date  . APPENDECTOMY    . BREAST SURGERY     papaloma removed  . CHOLECYSTECTOMY    .  FOOT SURGERY    . HERNIA REPAIR    . TONSILLECTOMY    . TOTAL ABDOMINAL HYSTERECTOMY    . TUBAL LIGATION    . WISDOM TOOTH EXTRACTION      Her Family History Is Significant For: Family History  Problem Relation Age of Onset  . Cirrhosis Mother   . Diabetes Mother   . Heart attack Father   . Hyperlipidemia Father   . Breast cancer Sister     Her Social History Is Significant For: Social History   Socioeconomic History  . Marital status: Single    Spouse name: Not on  file  . Number of children: Not on file  . Years of education: Not on file  . Highest education level: Not on file  Occupational History  . Not on file  Tobacco Use  . Smoking status: Never Smoker  . Smokeless tobacco: Never Used  Vaping Use  . Vaping Use: Never used  Substance and Sexual Activity  . Alcohol use: Not Currently  . Drug use: Never  . Sexual activity: Not on file  Other Topics Concern  . Not on file  Social History Narrative  . Not on file   Social Determinants of Health   Financial Resource Strain: Not on file  Food Insecurity: Not on file  Transportation Needs: Not on file  Physical Activity: Not on file  Stress: Not on file  Social Connections: Not on file    Her Allergies Are:  Allergies  Allergen Reactions  . Tape Other (See Comments)    Plastic tape & tegaderm - breaks skin out (paper tape ok)  . Codeine Nausea And Vomiting  . Sulfamethoxazole Hives and Rash  . Tramadol Nausea And Vomiting  :   Her Current Medications Are:  Outpatient Encounter Medications as of 06/06/2020  Medication Sig  . atorvastatin (LIPITOR) 10 MG tablet Take 10 mg by mouth at bedtime.  . Multiple Vitamin (MULTIVITAMIN) capsule Take 1 capsule by mouth daily.  . [DISCONTINUED] topiramate (TOPAMAX) 50 MG tablet Take 50 mg by mouth at bedtime.  . topiramate (TOPAMAX) 50 MG tablet Take 1.5 tablets (75 mg total) by mouth at bedtime.  . [DISCONTINUED] butalbital-acetaminophen-caffeine (FIORICET) 50-325-40 MG tablet Take by mouth 2 (two) times daily as needed for headache.  . [DISCONTINUED] Choline Fenofibrate (FENOFIBRIC ACID) 135 MG CPDR Take 1 capsule by mouth daily.  . [DISCONTINUED] estradiol (ESTRACE) 2 MG tablet Take 2 mg by mouth daily.  . [DISCONTINUED] meloxicam (MOBIC) 15 MG tablet Take 15 mg by mouth daily.  . [DISCONTINUED] nabumetone (RELAFEN) 750 MG tablet Take 1 tablet (750 mg) by mouth daily for 2 weeks then take 1 tablet daily as needed.  . [DISCONTINUED] Omega-3  1000 MG CAPS Take 1 g by mouth daily.  . [DISCONTINUED] PERCOCET 5-325 MG tablet 1 EACH PO TID   No facility-administered encounter medications on file as of 06/06/2020.  :   Review of Systems:  Out of a complete 14 point review of systems, all are reviewed and negative with the exception of these symptoms as listed below:  Review of Systems  Neurological:       Here for worsening migraines. Pt reports hx of migraines and reports 1 daily over the last 3 month.  Pt reports she has tried- amitriptyline, rizatriptan nurtec ubrelvy,fiorcet, and effexor Reports none of these meds have helped. Pt reports she does not no what alternatives would be next.  Currently on Topamax 50 daily and aleve PRN. Has not  tried beta blockers or CGRP injections to her knowledge.     Objective:  Neurological Exam  Physical Exam Physical Examination:   Vitals:   06/06/20 0956  BP: 117/72  Pulse: 77    General Examination: The patient is a very pleasant 50 y.o. female in no acute distress. She appears well-developed and well-nourished and well groomed.  No significant photophobia currently.  Tolerates exam well.  HEENT: Normocephalic, atraumatic, pupils are equal, round and reactive to light and accommodation. Funduscopic exam is normal with sharp disc margins noted. Extraocular tracking is good without limitation to gaze excursion or nystagmus noted. Normal smooth pursuit is noted. Hearing is grossly intact. Face is symmetric with normal facial animation and normal facial sensation to light touch, temperature and vibration. Speech is clear with no dysarthria noted. There is no hypophonia. There is no lip, neck/head, jaw or voice tremor. Neck is supple with full range of passive and active motion. There are no carotid bruits on auscultation. Oropharynx exam reveals: moderate mouth dryness, adequate dental hygiene and mild airway crowding, due to smaller airway, tonsils absent.  Tongue protrudes centrally and  palate elevates symmetrically.    Chest: Clear to auscultation without wheezing, rhonchi or crackles noted.  Heart: S1+S2+0, regular and normal without murmurs, rubs or gallops noted.   Abdomen: Soft, non-tender and non-distended with normal bowel sounds appreciated on auscultation.  Extremities: There is no pitting edema in the distal lower extremities bilaterally.  Skin: Warm and dry without trophic changes noted.  Musculoskeletal: exam reveals no obvious joint deformities, tenderness or joint swelling or erythema.   Neurologically:  Mental status: The patient is awake, alert and oriented in all 4 spheres. Her immediate and remote memory, attention, language skills and fund of knowledge are appropriate. There is no evidence of aphasia, agnosia, apraxia or anomia. Speech is clear with normal prosody and enunciation. Thought process is linear. Mood is normal and affect is normal.  Cranial nerves II - XII are as described above under HEENT exam. In addition: shoulder shrug is normal with equal shoulder height noted. Motor exam: Normal bulk, strength and tone is noted. There is no drift, tremor or rebound. Romberg is negative. Reflexes are 2+ throughout. Babinski: Toes are flexor bilaterally. Fine motor skills and coordination: intact with normal finger taps, normal hand movements, normal rapid alternating patting, normal foot taps and normal foot agility.  Cerebellar testing: No dysmetria or intention tremor on finger to nose testing. Heel to shin is unremarkable bilaterally. There is no truncal or gait ataxia.  Sensory exam: intact to light touch, vibration, temperature sense in the upper and lower extremities.  Gait, station and balance: She stands easily. No veering to one side is noted. No leaning to one side is noted. Posture is age-appropriate and stance is narrow based. Gait shows normal stride length and normal pace. No problems turning are noted. Tandem walk is unremarkable.            Assessment and Plan:   In summary, Mackenzie Burns is a very pleasant 50 y.o.-year old female with an underlying medical history of thyroid nodule, hyperlipidemia, back pain, shoulder pain on the right, fibrocystic disease of the breast, and biliary dyskinesia, who presents for evaluation of her headache disorder.  She has a history of chronic migraines, in the past 3 months she has had a more constant, nearly daily headache, headache may have evolved into a chronic daily headache.  Contributors may include suboptimal hydration, migraine attacks, excessive caffeine use.  Underlying sleep disordered breathing is also a possibility.  We talked about headache triggers and contributors.  She is advised to stay better hydrated with water, drink 6 to 8 cups of water per day, 8 ounce size each.  She is furthermore advised to limit her caffeine intake to 1-3 servings at the most, 8 ounce size each.  She is advised to proceed with a sleep study to look for obstructive sleep apnea, if she has sleep apnea, I will likely recommend a CPAP or AutoPap machine.  For headache prevention, she was recently started on Topamax.  I think this is a good choice.  She is advised to increase his from currently 50 mg at bedtime to 75 mg at bedtime.  We can consider a different preventative such as injectable medication in the near future.  She has not had a recent brain MRI.  To rule out a structural cause of this increase in her headache I would like to proceed with a brain MRI with and without contrast.  She is agreeable.  Neurological exam is nonfocal and she is reassured in that regard.  We will plan a follow-up in this clinic for her to see one of our nurse practitioners in 3 months, we will keep her posted in the interim with regards to her brain MRI and sleep study results by phone call.  I answered all her questions today and she was in agreement. Thank you very much for allowing me to participate in the care of this nice  patient. If I can be of any further assistance to you please do not hesitate to call me at [phone].  Sincerely,   Star Age, MD, PhD

## 2020-06-06 NOTE — Telephone Encounter (Signed)
[facility] auth: [employer] spoke to Marjory Lies Ref # [id number] order faxed to [facility] for open MRI they will reach out to the patient to schedule

## 2020-06-06 NOTE — Telephone Encounter (Signed)
schedule for 5/5 1230PM

## 2020-06-16 ENCOUNTER — Ambulatory Visit (INDEPENDENT_AMBULATORY_CARE_PROVIDER_SITE_OTHER): Payer: Commercial Managed Care - PPO | Admitting: Neurology

## 2020-06-16 DIAGNOSIS — R519 Headache, unspecified: Secondary | ICD-10-CM

## 2020-06-16 DIAGNOSIS — R351 Nocturia: Secondary | ICD-10-CM

## 2020-06-16 DIAGNOSIS — G4733 Obstructive sleep apnea (adult) (pediatric): Secondary | ICD-10-CM

## 2020-06-16 DIAGNOSIS — G43019 Migraine without aura, intractable, without status migrainosus: Secondary | ICD-10-CM

## 2020-06-16 DIAGNOSIS — R0683 Snoring: Secondary | ICD-10-CM

## 2020-06-16 DIAGNOSIS — Z789 Other specified health status: Secondary | ICD-10-CM

## 2020-06-21 NOTE — Progress Notes (Signed)
See procedure note.

## 2020-06-23 NOTE — Progress Notes (Signed)
Patient referred by Charlott Holler, NP, seen by me on 06/06/20, HST on 06/15/20 (mail out [employer]).   Please call and notify the patient that the recent home sleep test showed obstructive sleep apnea. OSA is overall mild, but would be worth treating to see if she feels better after treatment, especially with regard to her HAs. To that end, we can consider treatment in the form of autoPAP, which means, that we don't have to bring her in for a sleep study with CPAP, but will let her try an autoPAP machine at home, through a DME company (of her choice, or as per insurance requirement). The DME representative will educate her on how to use the machine, how to put the mask on, etc. I have not put an order in yet, please let me know, how she would like to proceed. Alternative treatment could be avoiding the supine sleep position or a dental device. If she would like to pursue dental treatment, I can place a referral to dentistry, a dentist of her choosing, or we can refer to one of the practices we know participate in OSA treatment. Again, let me know.   Star Age, MD, PhD [facility])

## 2020-06-23 NOTE — Procedures (Signed)
[facility] [place] ([facility])  STUDY DATE: 06/15/20  DOB: 04-13-1970  MRN: [id number]  ORDERING CLINICIAN: Star Age, MD, PhD   REFERRING CLINICIAN: Philmore Pali, NP   CLINICAL INFORMATION/HISTORY: 50 year old woman with a history of thyroid nodule, hyperlipidemia, back pain, shoulder pain on the right, fibrocystic disease of the breast, and biliary dyskinesia, who reports a history of migraines. She reports snoring and nocturia.   Epworth sleepiness score: 2/24.  BMI: 23.7 Kg/m   FINDINGS:   Total Record Time (hours, min): 7 H 24 min  Total Sleep Time (hours, min):  6 H 44 min  Percent REM (%):    25.74 %    Calculated pAHI (per hour):  6.0      REM pAHI: 13.1   NREM pAHI: 3.6 Supine AHI: 12.2   Oxygen Saturation (%) Mean: 95  Minimum oxygen saturation (%):        88   O2 Saturation Range (%): 88-99  O2Saturation (minutes) <=88%: 0.1 min   Pulse Mean (bpm):    67  Pulse Range (55-106)   IMPRESSION: OSA (obstructive sleep apnea), mild   RECOMMENDATION:  This HST shows mild obstructive sleep apnea, with an AHI of 6/hour and O2 nadir of 88%. Treatment with positive airway pressure can be considered with autoPAP, if desired by patient. Treatment options otherwise may include avoidance of the supine sleep position or a dental device. These different avenues will be discussed with the patient. The patient will be seen in follow up in sleep clinic, if necessary. Please note, that other causes of the patient's symptoms, including circadian rhythm disturbances, an underlying mood disorder, medication effect and/or an underlying medical problem cannot be ruled out based on this test. Clinical correlation is recommended. The patient should be cautioned not to drive, work at heights, or operate dangerous or heavy equipment when tired or sleepy. Review and reiteration of good sleep hygiene measures should be pursued with any patient. The referring provider will  be notified of the test results.   I certify that I have reviewed the raw data recording prior to the issuance of this report in accordance with the standards of the American Academy of Sleep Medicine (AASM).  INTERPRETING PHYSICIAN:  Star Age, MD, PhD  Board Certified in Neurology and Sleep Medicine  [facility] [address] [place],  [address] [phone]  Sleep Summary  Oxygen Saturation Statistics   Start Study Time: End Study Time: Total Recording Time:         10:04:17 PM 5:28:50 AM 7 h, 24 min  Total Sleep Time % REM of Sleep Time:  6 h, 44 min  25.7    Mean: 95 Minimum: 88 Maximum: 99  Mean of Desaturations Nadirs (%):   93  Oxygen Desatur. %: 4-9 10-20 >20 Total  Events Number Total  9 100.0  0 0.0  0 0.0  9 100.0  Oxygen Saturation: <90 <=88 <85 <80 <70  Duration (minutes): Sleep % 0.5 0.1 0.1 0.0 0.0 0.0 0.0 0.0 0.0 0.0     Respiratory Indices      Total Events REM NREM All Night  pRDI: pAHI 3%: ODI 4%: pAHIc 3%: % CSR: pAHI 4%:  54 40   9  0 0.0 10 16.1 13.1 2.4 0.0 5.4 3.6 1.0 0.0 8.1 6.0 1.4 0.0 1.5       Pulse Rate Statistics during Sleep (BPM)      Mean: 67  Minimum: 55 Maximum: 106        Body Position Statistics  Position Supine Prone Right Left Non-Supine  Sleep (min) 156.0 0.0 0.0 248.0 248.0  Sleep % 38.6 0.0 0.0 61.4 61.4  pRDI 15.3 N/A N/A 3.6 3.6  pAHI 3% 12.2 N/A N/A 2.2 2.2  ODI 4% 3.2 N/A N/A 0.2 0.2     Snoring Statistics Snoring Level (dB) >40 >50 >60 >70 >80 >Threshold (45)  Sleep (min) 11.3 1.9 0.7 0.0 0.0 3.8  Sleep % 2.8 0.5 0.2 0.0 0.0 0.9    Mean: 40 dB

## 2020-06-26 ENCOUNTER — Telehealth: Payer: Self-pay

## 2020-06-26 DIAGNOSIS — G4733 Obstructive sleep apnea (adult) (pediatric): Secondary | ICD-10-CM

## 2020-06-26 NOTE — Telephone Encounter (Signed)
Pt returned phone call, will call back after 2 pm.

## 2020-06-26 NOTE — Telephone Encounter (Signed)
-----   Message from Star Age, MD sent at 06/23/2020 10:14 AM EDT ----- Patient referred by Charlott Holler, NP, seen by me on 06/06/20, HST on 06/15/20 (mail out [employer]).   Please call and notify the patient that the recent home sleep test showed obstructive sleep apnea. OSA is overall mild, but would be worth treating to see if she feels better after treatment, especially with regard to her HAs. To that end, we can consider treatment in the form of autoPAP, which means, that we don't have to bring her in for a sleep study with CPAP, but will let her try an autoPAP machine at home, through a DME company (of her choice, or as per insurance requirement). The DME representative will educate her on how to use the machine, how to put the mask on, etc. I have not put an order in yet, please let me know, how she would like to proceed. Alternative treatment could be avoiding the supine sleep position or a dental device. If she would like to pursue dental treatment, I can place a referral to dentistry, a dentist of her choosing, or we can refer to one of the practices we know participate in OSA treatment. Again, let me know.   Star Age, MD, PhD [facility])

## 2020-06-26 NOTE — Telephone Encounter (Signed)
I called pt. No answer, left a message asking pt to call me back.   

## 2020-06-26 NOTE — Telephone Encounter (Signed)
Pt called me back and we dicussed sleep study result.   Pt was agreeable to trying the oral appliance and I have sent order to Dr. Ron Parker office.   Pt will CB if insurance presents a problem with fitting the pt for the device.

## 2020-06-26 NOTE — Addendum Note (Signed)
Addended by: Verlin Grills on: 06/26/2020 04:39 PM   Modules accepted: Orders

## 2020-06-28 ENCOUNTER — Telehealth: Payer: Self-pay | Admitting: Internal Medicine

## 2020-06-28 NOTE — Addendum Note (Signed)
Addended by: Jacqualin Combes on: 06/28/2020 04:29 PM   Modules accepted: Orders

## 2020-06-28 NOTE — Telephone Encounter (Signed)
Pt called asking if we could please release the Ultrasound Order for her thyroid. Pt states she was going to get it done yesterday but they couldn't see the order and she would like to get it done before her appt in June.

## 2020-06-28 NOTE — Telephone Encounter (Signed)
It shows the order is released.

## 2020-06-29 ENCOUNTER — Ambulatory Visit: Payer: Commercial Managed Care - PPO | Admitting: Internal Medicine

## 2020-07-05 NOTE — Telephone Encounter (Signed)
Faxed referral to Dr. Kae Heller office. Fax: [phone]. Sarah/Referrals phone: [phone]

## 2020-08-07 ENCOUNTER — Other Ambulatory Visit: Payer: Self-pay | Admitting: *Deleted

## 2020-08-07 ENCOUNTER — Telehealth: Payer: Self-pay | Admitting: *Deleted

## 2020-08-07 DIAGNOSIS — E042 Nontoxic multinodular goiter: Secondary | ICD-10-CM

## 2020-08-07 NOTE — Telephone Encounter (Signed)
Pt requesting to change the location to [facility]. Re-order and change location and reminded pt to keep the appt. 08/11/20 ok by dr. Annetta Maw.

## 2020-08-09 ENCOUNTER — Telehealth: Payer: Self-pay | Admitting: Neurology

## 2020-08-09 NOTE — Telephone Encounter (Signed)
I called the pt and left a vm advising of results. Ok per dpr, pt advised to call back if she had questions.

## 2020-08-09 NOTE — Telephone Encounter (Signed)
Order has been sent to [facility]

## 2020-08-09 NOTE — Telephone Encounter (Signed)
I received patient's brain MRI report.  She had a brain MRI with and without contrast through [facility] on 08/04/2020 and I reviewed the results: Impression: No acute intracranial abnormality.  Multiple small discrete cerebral white matter lesions are identified.  These lesions are nonspecific with a broad differential diagnosis (e.g. prior trauma/inflammation/demyelination, or chronic ischemia associated with migraine/atherosclerosis/other vasculopathies), correlate clinically.   Please call patient and advise her that her brain MRI did not show any acute abnormalities and showed a normal structure, normal brain volume, normal contrast uptake. Chronic changes were seen which are often seen in patients with longstanding history of migraines.  In addition, chronic changes with respect to hardening of the arteries can be seen in patients who have have hypertension and smoking or prior smoking and elevated cholesterol.    She can keep her appointment as scheduled.

## 2020-08-11 ENCOUNTER — Encounter: Payer: Self-pay | Admitting: Internal Medicine

## 2020-08-11 ENCOUNTER — Other Ambulatory Visit: Payer: Self-pay

## 2020-08-11 ENCOUNTER — Ambulatory Visit (INDEPENDENT_AMBULATORY_CARE_PROVIDER_SITE_OTHER): Payer: Commercial Managed Care - PPO | Admitting: Internal Medicine

## 2020-08-11 VITALS — BP 110/70 | HR 98 | Ht 66.5 in | Wt 146.0 lb

## 2020-08-11 DIAGNOSIS — E042 Nontoxic multinodular goiter: Secondary | ICD-10-CM | POA: Diagnosis not present

## 2020-08-11 NOTE — Progress Notes (Signed)
Name: Mackenzie Burns  MRN/ DOB: [id number], 08/27/1970    Age/ Sex: 50 y.o., female     PCP: Philmore Pali, NP   Reason for Endocrinology Evaluation: Thyroid Nodule     Initial Endocrinology Clinic Visit: 12/11/2018    PATIENT IDENTIFIER: Mackenzie Burns is a 50 y.o., female with a past medical history of Thyromegaly. She has followed with [facility] since 12/11/2018 for consultative assistance with management of her Right thyroid nodule.   HISTORICAL SUMMARY: The patient was first diagnosed with MNG in 2015. She is S/P FNA of the right  superior nodule 1.2 cm on 11/03/2018  but the sample was lost, repeat FNA in 05/2019 showed Atypia with negative Thyroseq     Younger  Brother with hyperthyroidism   Father had a goiter.    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 She had a repeat FNA of the right thyroid nodule in 04/2019 as the previous report was lost ( Apparently showed Atypia ) Thyroseq came back negative    She denies local neck but has occasional dysphagia  Denies diarrhea  No palpitations Has floaters , recent eye exam is negative. 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 She has never  used smokeless tobacco. She reports previous alcohol use. She reports that she does not use drugs. Family History:  Family History  Problem Relation Age of Onset   Cirrhosis Mother    Diabetes Mother    Heart attack Father    Hyperlipidemia Father    Breast cancer Sister      HOME MEDICATIONS: Allergies as of 08/11/2020       Reactions   Tape Other (See Comments)   Plastic tape & tegaderm - breaks skin out (paper tape ok)   Codeine Nausea And Vomiting   Sulfamethoxazole Hives, Rash   Tramadol Nausea And Vomiting        Medication List        Accurate as of August 11, 2020 12:39 PM. If you have any questions, ask your nurse or doctor.          atorvastatin 10 MG tablet Commonly known as: LIPITOR Take 10 mg by mouth at bedtime.   multivitamin capsule Take 1 capsule by mouth daily.   topiramate 50 MG tablet Commonly known as: TOPAMAX Take 1.5 tablets (75 mg total) by mouth at bedtime.          OBJECTIVE:   PHYSICAL EXAM: VS: BP 110/70   Pulse 98   Ht 5' 6.5" (1.689 m)  Wt 146 lb (66.2 kg)   SpO2 99%   BMI 23.21 kg/m   EXAM: General: Pt appears well and is in NAD  Neck: General: Supple without adenopathy. Thyroid: Thyroid size normal.  Right  nodule appreciated.  Lungs: Clear with good BS bilat with no rales, rhonchi, or wheezes  Heart: Auscultation: RRR.  Abdomen: Normoactive bowel sounds, soft, nontender, without masses or organomegaly palpable  Extremities:  BL LE: No pretibial edema normal ROM and strength.  Mental Status: Judgment, insight: Intact Mood and affect: No depression, anxiety, or agitation     DATA REVIEWED:  05/19/2019 TSH 1.03 uIU/Ml     Thyroid Ultrasound 11/03/2018 Multiple subcentimeter nodules, dominant superior right nodule 1.4 cm (previously 1.2 cm )          ASSESSMENT / PLAN / RECOMMENDATIONS:   Multinodular Goiter:   -Patient is clinically and biochemically euthyroid -No local neck symptoms - She  had FNA of the right superior 1.2 cm  nodule on 10/29/2018 with Atypia ( no official report) Apparently molecular testing results were lost and FNA had to be repeated on 06/07/2019 , cytology report not available but Molecular testing Tyna Jaksch) was negative -She is scheduled for repeat ultrasound on 08/14/2020    F/U in 1 yr    Signed electronically by: Mack Guise, MD  [facility]  [facility] [address] [place], [place] [address] Phone: [phone] FAX: [phone]      CC: Philmore Pali, NP [place] [address] Phone: [phone]  Fax: [phone]   Return to Endocrinology clinic as below: Future Appointments  Date Time Provider [place]  08/11/2020  2:40 PM Suzzane Quilter, Melanie Crazier, MD [facility] None  12/11/2020  3:30 PM Lomax, Amy, NP [facility] None

## 2020-08-14 ENCOUNTER — Telehealth: Payer: Self-pay | Admitting: Internal Medicine

## 2020-08-14 NOTE — Telephone Encounter (Signed)
PLease let the pt know that her thyroid ultrasound shows that her right thyroid nodule is stable form the last year which is good news.      Thanks   Abby Nena Jordan, MD  [facility]  [facility] [address] [place], [place] [address] Phone: [phone] FAX: [phone]

## 2020-08-16 NOTE — Telephone Encounter (Signed)
Notified pt husband with thyroid ultrasound results.

## 2020-09-11 ENCOUNTER — Ambulatory Visit: Payer: Commercial Managed Care - PPO | Admitting: Family Medicine

## 2020-12-02 IMAGING — CT CT HEART MORP W/ CTA COR W/ SCORE W/ CA W/CM &/OR W/O CM
4 of 6 series · 10 of 20 positions shown, 11 images · IV contrast (APPLIED)
Comparison: None.

Addendum:
EXAM:
OVER-READ INTERPRETATION  CT CHEST

The following report is an over-read performed by radiologist Dr.
Hibo Denison [REDACTED] on 04/09/2018. This
over-read does not include interpretation of cardiac or coronary
anatomy or pathology. The coronary calcium score/coronary CTA
interpretation by the cardiologist is attached.
CLINICAL DATA: 47-year-old female with chest pain.
Cardiac/Coronary  CT
TECHNIQUE: The patient was scanned on a Phillips Force scanner.

[Series 7: best diast 76 % · axial · 0.35mm/px · z∈[+1192,+1231]mm · 2 of 291 slices shown, 3 images]
[im 97/291  vessel]
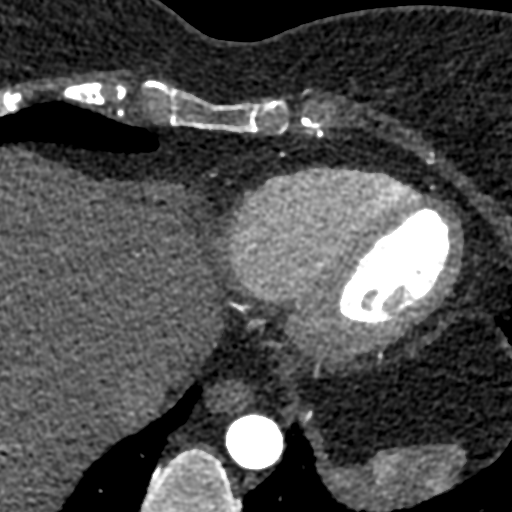
[im 97/291  lung]
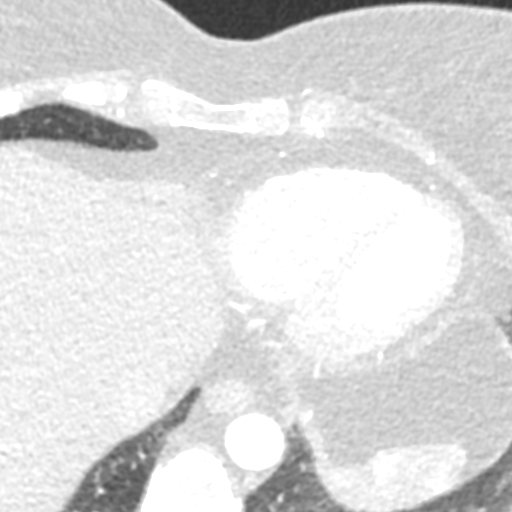
[im 194/291  vessel]
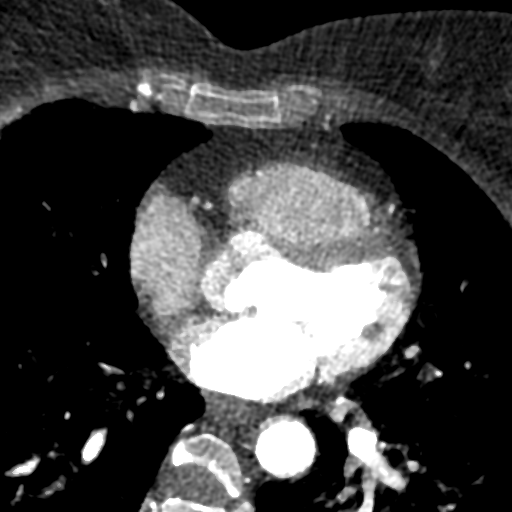

[Series 8: best syst 45 % · axial · 0.35mm/px · z∈[+1192,+1231]mm · 2 of 291 slices shown]
[im 97/291  vessel]
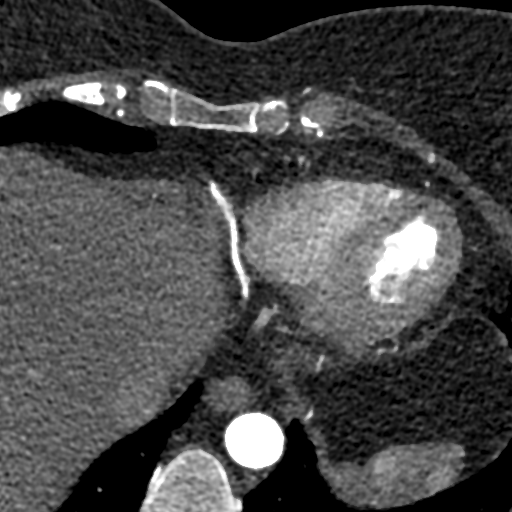
[im 194/291  vessel]
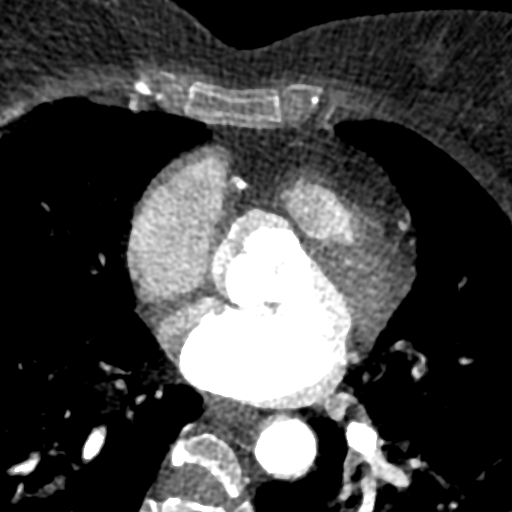

[Series 9: ts diast sharp 76 % · axial · 0.35mm/px · z∈[+1182,+1240]mm · 3 of 291 slices shown]
[im 73/291  lung]
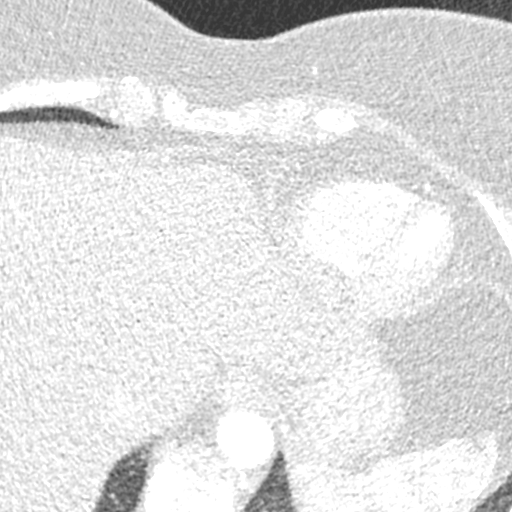
[im 146/291  lung]
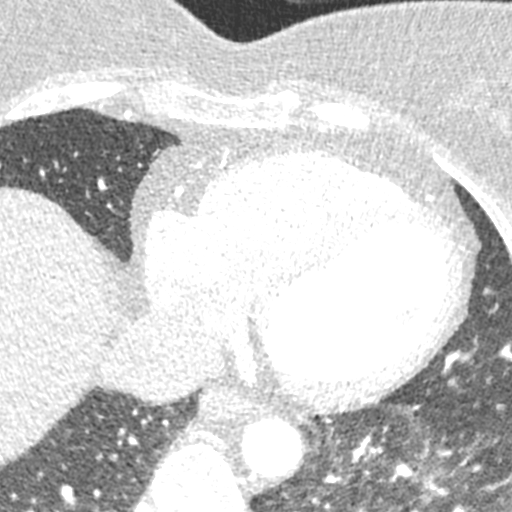
[im 218/291  lung]
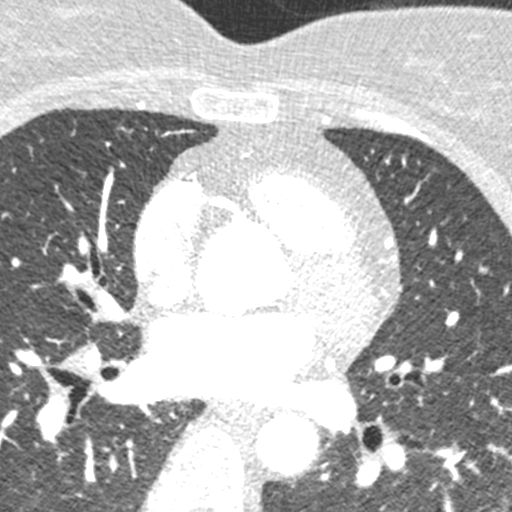

[Series 10: ts syst sharp 45 % · axial · 0.35mm/px · z∈[+1182,+1240]mm · 3 of 291 slices shown]
[im 73/291  lung]
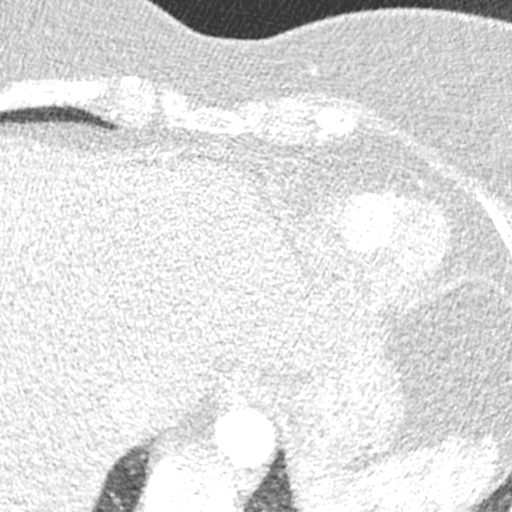
[im 146/291  lung]
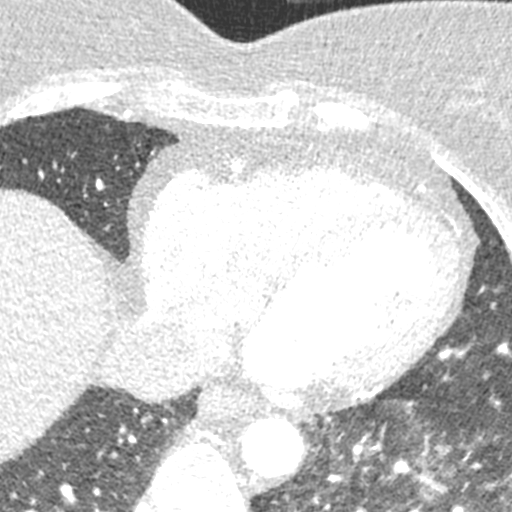
[im 218/291  lung]
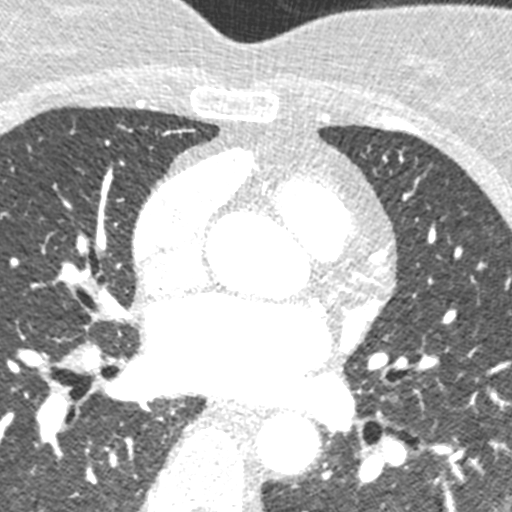

[10 of 20 positions shown; findings below may reference images not displayed]

FINDINGS: Within the visualized portions of the thorax there are no suspicious
appearing pulmonary nodules or masses, there is no acute
consolidative airspace disease, no pleural effusions, no
pneumothorax and no lymphadenopathy. Visualized portions of the
upper abdomen are unremarkable. There are no aggressive appearing
lytic or blastic lesions noted in the visualized portions of the
skeleton.
IMPRESSION: No significant incidental noncardiac findings are noted.
FINDINGS: A 120 kV prospective scan was triggered in the descending thoracic
aorta at 111 HU's. Axial non-contrast 3 mm slices were carried out
through the heart. The data set was analyzed on a dedicated work
station and scored using the Agatson method. Gantry rotation speed
was 250 msecs and collimation was .6 mm. No beta blockade and 0.8 mg
of sl NTG was given. The 3D data set was reconstructed in 5%
intervals of the 67-82 % of the R-R cycle. Diastolic phases were
analyzed on a dedicated work station using MPR, MIP and VRT modes.
The patient received 80 cc of contrast.

Aorta:  Normal size.  No calcifications.  No dissection.

Aortic Valve:  Trileaflet.  No calcifications.

Coronary Arteries:  Normal coronary origin.  Right dominance.

RCA is a large dominant artery that gives rise to PDA and PLVB.
There is no plaque.

Left main is a large artery that gives rise to LAD and LCX arteries.
Left main has no plaque.

LAD is a large vessel that gives rise to two diagonal arteries and
has no plaque.

LCX is a non-dominant artery that gives rise to one large OM1
branch. There is no plaque.

Other findings:

Normal pulmonary vein drainage into the left atrium.

Normal let atrial appendage without a thrombus.

Normal size of the pulmonary artery.
IMPRESSION: 1. Coronary calcium score of 0. This was 0 percentile for age and
sex matched control.

2. Normal coronary origin with right dominance.

3. No evidence of CAD.

*** End of Addendum ***

## 2020-12-11 ENCOUNTER — Ambulatory Visit: Payer: Commercial Managed Care - PPO | Admitting: Family Medicine

## 2020-12-11 ENCOUNTER — Encounter: Payer: Self-pay | Admitting: Family Medicine

## 2020-12-11 VITALS — BP 110/75 | HR 88 | Ht 66.0 in | Wt 143.0 lb

## 2020-12-11 DIAGNOSIS — G43109 Migraine with aura, not intractable, without status migrainosus: Secondary | ICD-10-CM

## 2020-12-11 MED ORDER — ERENUMAB-AOOE 140 MG/ML ~~LOC~~ SOAJ: 140.0000 mg | SUBCUTANEOUS | 3 refills | Status: DC

## 2020-12-11 NOTE — Progress Notes (Signed)
Chief Complaint  Patient presents with   Follow-up    Rm 1, pt with husband, pt still continues to struggle with migraines. Pt states she is having migraines every other day although she has learned to deal with them. She is here to review recent MRI finding in June (care everywhere) she was taken topamax and that was not working she has stopped. She has previously tried amitriptyline which caused too much drowsiness the next day. Acutely she has tried nurtec, sumatriptan, rizatriptan, nsaids, ubrelvy and fiorcet. Her BP runs on the low side.    HISTORY OF PRESENT ILLNESS:  12/11/20 ALL:  Ronella Plunk is a 50 y.o. female here today for follow up for chronic daily headaches. MRI was unremarkable. HST 06/16/2020 showed mild OSA with AHI of 6/hr. She was referred to Dr Ron Parker for consideration of oral appliance. She has not reached out for an appt. Topiramate was increased to 75mg  daily. She did not feel this was helping and stopped taking it. Nurtec did not help at all. She reports that the only thing that helps her headaches is pain medication like hydrocodone. She does not drink water. She drinks multiple Pepsi drinks daily. She eats regular meals. She sleeps very well.    Medications tried and failed: topiramate (ineffective), amitriptyline (sleepiness), propranolol contraindicated due to low BP Sumatriptan, rizatriptan, Nurtec, Ubrelvy, Nsaids, Fioricet    HISTORY (copied from Dr Guadelupe Sabin previous note)  Dear [name],    I saw your patient, [name], upon your kind request, in my Neurologic clinic today for initial consultation of her headaches, concern for migraines.  The patient is unaccompanied today.  As you know, Ms. Lapierre is a 50 year old right-handed woman with an underlying medical history of thyroid nodule, hyperlipidemia, back pain, shoulder pain on the right, fibrocystic disease of the breast, and biliary dyskinesia, who reports a history of migraines for about 20 years.   She has previously never seen a neurologist but has tried multiple medications.  In the past 3 months her headaches are more daily, constant, and global.  Migraine attacks have been throbbing and one-sided in the past.  She has had visual auras but not recently.  She currently reports a constant and nearly daily headache.  She has had nausea, no vomiting.  She denies any neurological accompaniments such as numbness or tingling or droopy face or slurring of speech, never had any sudden onset of neurological symptoms.  She had a recent follow-up appointment and blood work through your office on 05/22/2020, we will request test results.  She was also advised to increase her Topamax to 50 mg at bedtime.  She tolerates that, she has not had any telltale improvement yet to her headache.  She reports a headache of 10 out of 10 today. I reviewed your office note from 04/28/2020.  She has tried several preventative and acute medications including over-the-counter nonsteroidal anti-inflammatory medications, acetaminophen, Nurtec, Ubrelvy, and sumatriptan hand, rizatriptan and Fioricet.  She has been on amitriptyline more recently which made her sleepy at 25 mg strength.  In early March she was started on low-dose topiramate 25 mg at bedtime.  She has had light sensitivity, she does not typically have sound sensitivity.  She reports that chronic back pain has exacerbated her headaches.  She has seen neurosurgery for her back in the past and has scoliosis.  She has tried Percocet in the past which helped her back pain and also her headaches.  She has tried tramadol which caused  nausea and vomiting.  She sleeps fairly well, bedtime is around 10 and rise time around 530, she has nocturia about once or twice per average night, she suspects that her father has sleep apnea.  She has never had a sleep study.  She snores some.  She drinks caffeine in the form of a, 3-4 bottles per day on average, she does not typically drink any water.   She does not drink alcohol, she quit smoking in 2007.  She had a tonsillectomy in 1999.  She had an MRI some 15 years ago and it was reportedly normal at the time.  Her Epworth sleepiness score is 2 out of 24.   REVIEW OF SYSTEMS: Out of a complete 14 system review of symptoms, the patient complains only of the following symptoms, headaches, chronic back pain, and all other reviewed systems are negative.   ALLERGIES: Allergies  Allergen Reactions   Tape Other (See Comments)    Plastic tape & tegaderm - breaks skin out (paper tape ok)   Codeine Nausea And Vomiting   Sulfamethoxazole Hives and Rash   Tramadol Nausea And Vomiting     HOME MEDICATIONS: Outpatient Medications Prior to Visit  Medication Sig Dispense Refill   atorvastatin (LIPITOR) 10 MG tablet Take 10 mg by mouth at bedtime.     Multiple Vitamin (MULTIVITAMIN) capsule Take 1 capsule by mouth daily.     Omega-3 Fatty Acids (OMEGA-3 FISH OIL PO) Take 1 tablet by mouth daily.     topiramate (TOPAMAX) 50 MG tablet Take 1.5 tablets (75 mg total) by mouth at bedtime. (Patient not taking: Reported on 08/11/2020) 45 tablet 3   No facility-administered medications prior to visit.     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   Right upper quadrant abdominal pain 08/17/2015     PAST SURGICAL HISTORY: Past Surgical History:  Procedure Laterality Date   APPENDECTOMY     BREAST SURGERY     papaloma removed   CHOLECYSTECTOMY     FOOT SURGERY     HERNIA REPAIR     TONSILLECTOMY     TOTAL ABDOMINAL HYSTERECTOMY     TUBAL LIGATION     WISDOM TOOTH EXTRACTION       FAMILY HISTORY: Family History  Problem Relation Age of Onset   Cirrhosis Mother    Diabetes Mother    Heart attack Father     Hyperlipidemia Father    Breast cancer Sister      SOCIAL HISTORY: Social History   Socioeconomic History   Marital status: Single    Spouse name: Not on file   Number of children: Not on file   Years of education: Not on file   Highest education level: Not on file  Occupational History   Not on file  Tobacco Use   Smoking status: Never   Smokeless tobacco: Never  Vaping Use   Vaping Use: Never used  Substance and Sexual Activity   Alcohol use: Not Currently   Drug use: Never   Sexual activity: Not on file  Other Topics Concern   Not on file  Social History Narrative   Not on file   Social Determinants of Health   Financial Resource Strain: Not on file  Food Insecurity: Not on file  Transportation Needs: Not on file  Physical Activity: Not on file  Stress: Not on file  Social Connections: Not on file  Intimate Partner Violence: Not on file     PHYSICAL EXAM  Vitals:   12/11/20 1505  BP: 110/75  Pulse: 88  Weight: 143 lb (64.9 kg)  Height: 5\' 6"  (1.676 m)   Body mass index is 23.08 kg/m.  Generalized: Well developed, in no acute distress  Cardiology: normal rate and rhythm, no murmur auscultated  Respiratory: clear to auscultation bilaterally    Neurological examination  Mentation: Alert oriented to time, place, history taking. Follows all commands speech and language fluent Cranial nerve II-XII: Pupils were equal round reactive to light. Extraocular movements were full, visual field were full on confrontational test. Facial sensation and strength were normal. Head turning and shoulder shrug  were normal and symmetric. Motor: The motor testing reveals 5 over 5 strength of all 4 extremities. Good symmetric motor tone is noted throughout.  Gait and station: Gait is normal.   DIAGNOSTIC DATA (LABS, IMAGING, TESTING) - I reviewed patient records, labs, notes, testing and imaging myself where available.  No results found for: WBC, HGB, HCT, MCV, PLT     Component Value Date/Time   NA 140 04/03/2018 1424   K 3.8 04/03/2018 1424   CL 102 04/03/2018 1424   CO2 25 04/03/2018 1424   GLUCOSE 95 04/03/2018 1424   BUN 9 04/03/2018 1424   CREATININE 0.56 (L) 04/03/2018 1424   CALCIUM 9.2 04/03/2018 1424   GFRNONAA 111 04/03/2018 1424   GFRAA 128 04/03/2018 1424   No results found for: CHOL, HDL, LDLCALC, LDLDIRECT, TRIG, CHOLHDL No results found for: HGBA1C No results found for: VITAMINB12 No results found for: TSH  No flowsheet data found.   No flowsheet data found.   ASSESSMENT AND PLAN  50 y.o. year old female  has a past medical history of Biliary dyskinesia (07/25/2017), Chest pain in adult (03/18/2018), Fibrocystic breast changes, left (06/10/2017), Intraductal papilloma of breast, left (04/29/2016), and Right upper quadrant abdominal pain (08/17/2015). here with    Chronic migraine with aura   Tashea reports that daily headaches continue. Most all are described as migrainous. She has failed topiramate and amitriptyline. Propranolol contraindicated due to low BP. We will start Amovig 140mg  injections every 30 days. She was educated on appropriate administration and possible side effects. Proper storage also discussed. She will try Migrelief over the counter for abortive therapy. She was encouraged to reach out to Dr Ron Parker regarding oral appliance. She declines CPAP. She was encouraged to reduce amount of Pepsi soda drinks and add daily water. Goal of 50-60 ounces daily. Healthy lifestyle habits encouraged. She will follow up with me in 4-6 months, sooner if needed.   No orders of the defined types were placed in this encounter.    Meds ordered this encounter  Medications   Erenumab-aooe 140 MG/ML SOAJ    Sig: Inject 140 mg into the skin every 30 (thirty) days.    Dispense:  3 mL    Refill:  3    Order Specific Question:   Supervising Provider    Answer:   Melvenia Beam [[id number]]       Debbora Presto, MSN, FNP-C 12/11/2020,  4:08 PM  [facility] [address], [place], [place] [address] [phone]

## 2020-12-11 NOTE — Patient Instructions (Addendum)
Below is our plan:  We will try Amovig subcutaneous injection every 30 days. Try Migrelief for abortive therapy. Please try to start drinking more water every day. I recommend start with 2-3 glasses a day. Flavor it with whatever you like. Try to decrease Pepsi/caffeine.   Please make sure you are staying well hydrated. I recommend 50-60 ounces daily. Well balanced diet and regular exercise encouraged. Consistent sleep schedule with 6-8 hours recommended.   Please continue follow up with care team as directed.   Follow up with me in 4-6 months   You may receive a survey regarding today's visit. I encourage you to leave honest feed back as I do use this information to improve patient care. Thank you for seeing me today!

## 2020-12-12 ENCOUNTER — Telehealth: Payer: Self-pay

## 2020-12-12 NOTE — Telephone Encounter (Signed)
Submitted PA on CMM.  Key: [id number].   Waiting on determination from [facility].

## 2020-12-14 NOTE — Telephone Encounter (Signed)
Riez from the appeals office of [facility] called to inform of the approval from 12-12-20 to 06-14-21 there call back # if there are questions is [phone]

## 2021-04-11 ENCOUNTER — Telehealth: Payer: Self-pay

## 2021-04-11 NOTE — Telephone Encounter (Signed)
I have submitted a PA request for Aimovig 140mg /mL on CMM, Key: [id number]. Awaiting determination from [facility].

## 2021-04-12 NOTE — Telephone Encounter (Signed)
Request Reference Number: [id number]. AIMOVIG INJ 140MG /ML is approved through 04/11/2022. Your patient may now fill this prescription and it will be covered.

## 2021-04-23 ENCOUNTER — Telehealth: Payer: Self-pay | Admitting: Family Medicine

## 2021-04-23 DIAGNOSIS — G43109 Migraine with aura, not intractable, without status migrainosus: Secondary | ICD-10-CM

## 2021-04-23 NOTE — Telephone Encounter (Signed)
Pt states she has called around and has been unable to find Aimovig  anywhere.  Pt is asking for a call to discuss options.

## 2021-04-23 NOTE — Telephone Encounter (Signed)
Checked, we do not have samples of Aimovig to provide. Called [facility] at [phone]. Spoke w/ Caryl Pina. Cannot get Aimovig there. Wholesaler does not have it form them. Unsure if that is the same case as other whole sale places [employer]). Called [facility] at [phone] in [place], [place] and spoke w/ Sadie. Do not have in stock. Can order and come in a day.  I called pt to discuss. She previously used Paediatric nurse to fill Aimovig w/ copay card. Can only do 2 refills at the same pharmacy per insurance. [facility] cannot get it.  [facility] cannot use [facility] w/ her [facility] plan. She has only been on Aimovig, never tried Teaching laboratory technician or Ajovy. Been out of Aimovig for one week. She has been stable on this. Prefers [facility]. Ok to change to [employer]. Will send to [facility] if NP approves change. Most likely will require PA.

## 2021-04-24 MED ORDER — EMGALITY 120 MG/ML ~~LOC~~ SOAJ: 1.0000 mL | SUBCUTANEOUS | 3 refills | Status: DC

## 2021-04-24 MED ORDER — AJOVY 225 MG/1.5ML ~~LOC~~ SOAJ: 1.5000 mL | SUBCUTANEOUS | 3 refills | Status: DC

## 2021-04-24 NOTE — Telephone Encounter (Signed)
Submitted PA Emgality on CMM. Key: [id number]. Waiting on determination from [facility].

## 2021-04-24 NOTE — Telephone Encounter (Signed)
Called pt. Made her aware that insurance denied Emgality. She must try/fail Ajovy first. She is agreeable to switch. I sent rx to [facility]. Asked Emgality be d/c'd.

## 2021-04-24 NOTE — Addendum Note (Signed)
Addended by: Wyvonnia Lora on: 04/24/2021 04:21 PM   Modules accepted: Orders

## 2021-04-25 ENCOUNTER — Telehealth: Payer: Self-pay

## 2021-04-25 NOTE — Telephone Encounter (Signed)
PA for ajovy has been sent via vmm ?(Key: [id number]) ? ?Your information has been sent to [facility]. ?

## 2021-04-26 NOTE — Telephone Encounter (Signed)
PA for Ajovy has been approved. ?(Key: [id number]) ? ?This request has received a Favorable outcome. ? ?Please note any additional information provided by [facility] at the bottom of your screen. ?Request Reference Number: [id number]. AJOVY INJ 225/1.5 is approved through 10/26/2021. Your patient may now fill this prescription and it will be covered. ?

## 2021-05-07 ENCOUNTER — Encounter: Payer: Self-pay | Admitting: Family Medicine

## 2021-05-07 ENCOUNTER — Other Ambulatory Visit: Payer: Self-pay

## 2021-05-07 ENCOUNTER — Ambulatory Visit: Payer: Commercial Managed Care - PPO | Admitting: Family Medicine

## 2021-05-07 VITALS — BP 123/76 | HR 79 | Ht 66.5 in | Wt 144.0 lb

## 2021-05-07 DIAGNOSIS — G43109 Migraine with aura, not intractable, without status migrainosus: Secondary | ICD-10-CM | POA: Diagnosis not present

## 2021-05-07 DIAGNOSIS — G43E09 Chronic migraine with aura, not intractable, without status migrainosus: Secondary | ICD-10-CM

## 2021-05-07 NOTE — Patient Instructions (Signed)
Below is our plan: ? ?We will continue Ajovy.  ? ?Please make sure you are staying well hydrated. I recommend 50-60 ounces daily. Well balanced diet and regular exercise encouraged. Consistent sleep schedule with 6-8 hours recommended.  ? ?Please continue follow up with care team as directed.  ? ?Follow up with me in 1 year  ? ?You may receive a survey regarding today's visit. I encourage you to leave honest feed back as I do use this information to improve patient care. Thank you for seeing me today!  ? ? ?

## 2021-05-07 NOTE — Progress Notes (Signed)
Chief Complaint  Patient presents with   Follow-up    Pt with husband, rm 1. Overall stable and has no concerns. Just started Ajovy last week.     HISTORY OF PRESENT ILLNESS:  05/07/21 ALL:  Mackenzie Burns returns for follow up for headaches/migraines. We started Amovig in 11/2020. She was doing well but could not obtain refills due to supply issues. CGRP was switched to Ajovy due to insurance preference. She started Ajovy last week. She tolerated injection. Migraines had nearly resolved on Amovig. She had one mild headache on Amovig that resolved without abortive meds.   12/11/2020 ALL: Mackenzie Burns is a 51 y.o. female here today for follow up for chronic daily headaches. MRI was unremarkable. HST 06/16/2020 showed mild OSA with AHI of 6/hr. She was referred to Dr Ron Parker for consideration of oral appliance. She has not reached out for an appt. Topiramate was increased to '75mg'$  daily. She did not feel this was helping and stopped taking it. Nurtec did not help at all. She reports that the only thing that helps her headaches is pain medication like hydrocodone. She does not drink water. She drinks multiple Pepsi drinks daily. She eats regular meals. She sleeps very well.    Medications tried and failed: topiramate (ineffective), amitriptyline (sleepiness), propranolol contraindicated due to low BP Sumatriptan, rizatriptan, Nurtec, Ubrelvy, Nsaids, Fioricet    HISTORY (copied from Dr Guadelupe Sabin previous note)  Dear Mackenzie Burns,    I saw your patient, Mackenzie Burns, upon your kind request, in my Neurologic clinic today for initial consultation of her headaches, concern for migraines.  The patient is unaccompanied today.  As you know, Mackenzie Burns is a 51 year old right-handed woman with an underlying medical history of thyroid nodule, hyperlipidemia, back pain, shoulder pain on the right, fibrocystic disease of the breast, and biliary dyskinesia, who reports a history of migraines for about 20 years.  She  has previously never seen a neurologist but has tried multiple medications.  In the past 3 months her headaches are more daily, constant, and global.  Migraine attacks have been throbbing and one-sided in the past.  She has had visual auras but not recently.  She currently reports a constant and nearly daily headache.  She has had nausea, no vomiting.  She denies any neurological accompaniments such as numbness or tingling or droopy face or slurring of speech, never had any sudden onset of neurological symptoms.  She had a recent follow-up appointment and blood work through your office on 05/22/2020, we will request test results.  She was also advised to increase her Topamax to 50 mg at bedtime.  She tolerates that, she has not had any telltale improvement yet to her headache.  She reports a headache of 10 out of 10 today. I reviewed your office note from 04/28/2020.  She has tried several preventative and acute medications including over-the-counter nonsteroidal anti-inflammatory medications, acetaminophen, Nurtec, Ubrelvy, and sumatriptan hand, rizatriptan and Fioricet.  She has been on amitriptyline more recently which made her sleepy at 25 mg strength.  In early March she was started on low-dose topiramate 25 mg at bedtime.  She has had light sensitivity, she does not typically have sound sensitivity.  She reports that chronic back pain has exacerbated her headaches.  She has seen neurosurgery for her back in the past and has scoliosis.  She has tried Percocet in the past which helped her back pain and also her headaches.  She has tried tramadol which caused nausea and vomiting.  She sleeps fairly well, bedtime is around 10 and rise time around 530, she has nocturia about once or twice per average night, she suspects that her father has sleep apnea.  She has never had a sleep study.  She snores some.  She drinks caffeine in the form of a, 3-4 bottles per day on average, she does not typically drink any water.  She  does not drink alcohol, she quit smoking in 2007.  She had a tonsillectomy in 1999.  She had an MRI some 15 years ago and it was reportedly normal at the time.  Her Epworth sleepiness score is 2 out of 24.   REVIEW OF SYSTEMS: Out of a complete 14 system review of symptoms, the patient complains only of the following symptoms, none , and all other reviewed systems are negative.   ALLERGIES: Allergies  Allergen Reactions   Tape Other (See Comments)    Plastic tape & tegaderm - breaks skin out (paper tape ok)   Codeine Nausea And Vomiting   Sulfamethoxazole Hives and Rash   Tramadol Nausea And Vomiting     HOME MEDICATIONS: Outpatient Medications Prior to Visit  Medication Sig Dispense Refill   atorvastatin (LIPITOR) 10 MG tablet Take 10 mg by mouth at bedtime.     Fremanezumab-vfrm (AJOVY) 225 MG/1.5ML SOAJ Inject 1.5 mLs into the skin every 30 (thirty) days. 4.5 mL 3   Multiple Vitamin (MULTIVITAMIN) capsule Take 1 capsule by mouth daily.     Omega-3 Fatty Acids (OMEGA-3 FISH OIL PO) Take 1 tablet by mouth daily.     No facility-administered medications prior to visit.     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   Right upper quadrant abdominal pain 08/17/2015     PAST SURGICAL HISTORY: Past Surgical History:  Procedure Laterality Date   APPENDECTOMY     BREAST SURGERY     papaloma removed   CHOLECYSTECTOMY     FOOT SURGERY     HERNIA REPAIR     TONSILLECTOMY     TOTAL ABDOMINAL HYSTERECTOMY     TUBAL LIGATION     WISDOM TOOTH EXTRACTION       FAMILY HISTORY: Family History  Problem Relation Age of Onset   Cirrhosis Mother    Diabetes Mother    Heart attack Father    Hyperlipidemia Father    Breast cancer Sister      SOCIAL  HISTORY: Social History   Socioeconomic History   Marital status: Single    Spouse name: Not on file   Number of children: Not on file   Years of education: Not on file   Highest education level: Not on file  Occupational History   Not on file  Tobacco Use   Smoking status: Never   Smokeless tobacco: Never  Vaping Use   Vaping Use: Never used  Substance and Sexual Activity   Alcohol use: Not Currently   Drug use: Never   Sexual activity: Not on file  Other Topics Concern   Not on file  Social History Narrative   Not on file   Social Determinants of Health   Financial Resource Strain: Not on file  Food Insecurity: Not on file  Transportation Needs: Not on file  Physical Activity: Not on file  Stress: Not on file  Social Connections: Not on file  Intimate Partner Violence: Not on file     PHYSICAL EXAM  Vitals:   05/07/21 1450  BP: 123/76  Pulse: 79  Weight: 144 lb (65.3 kg)  Height: 5' 6.5" (1.689 m)   Body mass index is 22.89 kg/m.  Generalized: Well developed, in no acute distress  Cardiology: normal rate and rhythm, no murmur auscultated  Respiratory: clear to auscultation bilaterally    Neurological examination  Mentation: Alert oriented to time, place, history taking. Follows all commands speech and language fluent Cranial nerve II-XII: Pupils were equal round reactive to light. Extraocular movements were full, visual field were full on confrontational test. Facial sensation and strength were normal. Head turning and shoulder shrug  were normal and symmetric. Motor: The motor testing reveals 5 over 5 strength of all 4 extremities. Good symmetric motor tone is noted throughout.  Gait and station: Gait is normal.   DIAGNOSTIC DATA (LABS, IMAGING, TESTING) - I reviewed patient records, labs, notes, testing and imaging myself where available.  No results found for: WBC, HGB, HCT, MCV, PLT    Component Value Date/Time   NA 140 04/03/2018 1424   K 3.8  04/03/2018 1424   CL 102 04/03/2018 1424   CO2 25 04/03/2018 1424   GLUCOSE 95 04/03/2018 1424   BUN 9 04/03/2018 1424   CREATININE 0.56 (L) 04/03/2018 1424   CALCIUM 9.2 04/03/2018 1424   GFRNONAA 111 04/03/2018 1424   GFRAA 128 04/03/2018 1424   No results found for: CHOL, HDL, LDLCALC, LDLDIRECT, TRIG, CHOLHDL No results found for: HGBA1C No results found for: VITAMINB12 No results found for: TSH  No flowsheet data found.   No flowsheet data found.   ASSESSMENT AND PLAN  51 y.o. year old female  has a past medical history of Biliary dyskinesia (07/25/2017), Chest pain in adult (03/18/2018), Fibrocystic breast changes, left (06/10/2017), Intraductal papilloma of breast, left (04/29/2016), and Right upper quadrant abdominal pain (08/17/2015). here with    Chronic migraine with aura  Paige reports that headaches have nearly resolved. She could not get Amovig but was able to get Ajovy and has tolerated first injection. I will have her continue monthly. Healthy lifestyle habits encouraged. She will follow up with me in 1 year, sooner if needed. She verbalizes understanding and agreement with this plan.   No orders of the defined types were placed in this encounter.     No orders of the defined types were placed in this encounter.      Debbora Presto, MSN, FNP-C 05/07/2021, 3:27 PM  [facility] [address] [place], [place] [address] [phone]

## 2021-07-06 ENCOUNTER — Telehealth: Payer: Self-pay | Admitting: Family Medicine

## 2021-07-06 DIAGNOSIS — G43109 Migraine with aura, not intractable, without status migrainosus: Secondary | ICD-10-CM

## 2021-07-06 NOTE — Telephone Encounter (Signed)
Pt states she is unable to find Fremanezumab-vfrm (AJOVY) 225 MG/1.5ML SOAJM, anywhere. She states she was getting it at a  [facility] but her insurance will only allow her to use a pharmacy twice. Pt is asking for a call to discuss. ?

## 2021-07-09 MED ORDER — AIMOVIG 140 MG/ML ~~LOC~~ SOAJ: 1.0000 mL | SUBCUTANEOUS | 3 refills | Status: DC

## 2021-07-09 NOTE — Telephone Encounter (Addendum)
Tried [facility] to get more info on supply issue for Ajovy. Pharmacy closed until 8:30am. ? ?Called and spoke w/ pt. She can only use one pharmacy to fill prescription twice per insurance. She has tried [facility], out of stock of Ajovy. Cannot use [facility] w/ insurance. They do allow her to use mail order, but wants to use discount card for Ajovy.  ? ?Aimovig works better than Ajovy pt states. Only had 1 migraine with Aimovig. Wants to change back to Aimovig. Would like 3 months supply sent to [facility] order. She will call back if unaffordable or unable to get. ?Aware it may require PA which we will work on if needed. Pharmacy should let us know once they receive rx and run via insurance.  ? ?

## 2021-08-17 ENCOUNTER — Ambulatory Visit: Payer: Commercial Managed Care - PPO | Admitting: Internal Medicine

## 2021-08-20 ENCOUNTER — Ambulatory Visit: Payer: Commercial Managed Care - PPO | Admitting: Internal Medicine

## 2021-08-20 NOTE — Progress Notes (Deleted)
Name: Mackenzie Burns  MRN/ DOB: [id number], Feb 24, 1971    Age/ Sex: 51 y.o., female     PCP: Philmore Pali, NP   Reason for Endocrinology Evaluation: Thyroid Nodule     Initial Endocrinology Clinic Visit: 12/11/2018    PATIENT IDENTIFIER: Mackenzie Burns is a 51 y.o., female with a past medical history of Thyromegaly. She has followed with [place] [facility] since 12/11/2018 for consultative assistance with management of her Right thyroid nodule.   HISTORICAL SUMMARY: The patient was first diagnosed with MNG in 2015. She is S/P FNA of the right  superior nodule 1.2 cm on 11/03/2018  but the sample was lost, repeat FNA in 05/2019 showed Atypia with negative Thyroseq     Younger  Brother with hyperthyroidism   Father had a goiter.    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 She had a repeat FNA of the right thyroid nodule in 04/2019 as the previous report was lost ( Apparently showed Atypia ) Thyroseq came back negative    She denies local neck but has occasional dysphagia  Denies diarrhea  No palpitations Has floaters , recent eye exam is negative. 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 She has never  used smokeless tobacco. She reports that she does not currently use alcohol. She reports that she does not use drugs. Family History:  Family History  Problem Relation Age of Onset   Cirrhosis Mother    Diabetes Mother    Heart attack Father    Hyperlipidemia Father    Breast cancer Sister      HOME MEDICATIONS: Allergies as of 08/20/2021       Reactions   Tape Other (See Comments)   Plastic tape & tegaderm - breaks skin out (paper tape ok)   Codeine Nausea And Vomiting   Sulfamethoxazole Hives, Rash   Tramadol Nausea And Vomiting        Medication List        Accurate as of August 20, 2021 12:46 PM. If you have any questions, ask your nurse or doctor.          Aimovig 140 MG/ML Soaj Generic drug: Erenumab-aooe Inject 140 mg into the skin every 30 (thirty) days.   atorvastatin 10 MG tablet Commonly known as: LIPITOR Take 10 mg by mouth at bedtime.   multivitamin capsule Take 1 capsule by mouth daily.   OMEGA-3 FISH OIL PO Take 1 tablet by mouth daily.          OBJECTIVE:   PHYSICAL EXAM:  VS: There were no vitals taken for this visit.  EXAM: General: Pt appears well and is in NAD  Neck: General: Supple without adenopathy. Thyroid: Thyroid size normal.  Right  nodule appreciated.  Lungs: Clear with good BS bilat with no rales, rhonchi, or wheezes  Heart: Auscultation: RRR.  Abdomen: Normoactive bowel sounds, soft, nontender, without masses or organomegaly palpable  Extremities:  BL LE: No pretibial edema normal ROM and strength.  Mental Status: Judgment, insight: Intact Mood and affect: No depression, anxiety, or agitation     DATA REVIEWED:  05/19/2019 TSH 1.03 uIU/Ml     Thyroid Ultrasound 08/14/2020 Unchanged appearance of previously biopsied right superior thyroid nodule (1.2 cm previously 1.2 cm)   ASSESSMENT / PLAN / RECOMMENDATIONS:   Multinodular Goiter:   -Patient is clinically and biochemically euthyroid -No local neck  symptoms - She had FNA of the right superior 1.2 cm  nodule on 10/29/2018 with Atypia ( no official report) Apparently molecular testing results were lost and FNA had to be repeated on 06/07/2019 , cytology report not available but Molecular testing Tyna Jaksch) was negative -She is scheduled for repeat ultrasound on 08/14/2020    F/U in 1 yr    Signed electronically by: Mack Guise, MD  [facility]  [address] [place], [place] [address] Phone: [phone] FAX: [phone]      CC: Philmore Pali, NP [place] [address] Phone: [phone]  Fax: [phone]   Return to Endocrinology clinic as below: Future Appointments  Date Time Provider [place]  08/20/2021  2:40 PM Cordarius Benning, Melanie Crazier, MD [facility] None  05/08/2022  2:00 PM Lomax, Amy, NP [facility] None

## 2021-10-02 ENCOUNTER — Telehealth: Payer: Self-pay | Admitting: *Deleted

## 2021-10-02 NOTE — Telephone Encounter (Signed)
Took call from phone staff and spoke w/ Heather/[facility] (pharmacy w/ manufacturer of Aimovig). Pt had a defective aimovig pen. They will replace for pt if we provide VO. Spoke w/ Elaina/pharmacist to provide VO. Nothing further needed. They will process replacement for pt.

## 2022-02-28 ENCOUNTER — Telehealth: Payer: Self-pay

## 2022-02-28 NOTE — Telephone Encounter (Signed)
Called patient and she reported that she was having chest pain and chest pressure currently and her heart rate was ranging 160-140 with exertion and 113-130 at rest. She also stated that she was short of breath and that these symptoms started occurring before Christmas. I spoke to Dr. Bettina Gavia regarding the patient's symptoms and he recommended that she go to the ER to be evaluated. I relayed Dr. Joya Gaskins recommendation to the patient and she stated that she would go to the ER. Patient had no further questions at this time.

## 2022-02-28 NOTE — Telephone Encounter (Deleted)
Called patient and she reported that she was hav

## 2022-03-21 NOTE — Progress Notes (Signed)
Cardiology Office Note:    Date:  03/25/2022   ID:  Mackenzie Burns, DOB [birth year], MRN [id number]  PCP:  Leonides Sake, MD  Cardiologist:  Shirlee More, MD    Referring MD: No ref. provider found    ASSESSMENT:    1. Tachycardia with heart rate 141-160 beats per minute   2. Dyslipidemia    PLAN:    In order of problems listed above:  Is difficult to determine if this is sinus tachycardia or atrial arrhythmia in order to go ahead and apply 1 weeks ZIO monitor she gets alerts every day on her Fitbit and I think we will capture the episodes and decide if she needs further evaluation and treatment. Continue her statin   Next appointment: 6 weeks   Medication Adjustments/Labs and Tests Ordered: Current medicines are reviewed at length with the patient today.  Concerns regarding medicines are outlined above.  No orders of the defined types were placed in this encounter.  No orders of the defined types were placed in this encounter.   Chief Complaint  Patient presents with   Follow-up    She was seen at [facility] ED for complaints of rapid heartbeat.    History of Present Illness:    Mackenzie Burns is a 52 y.o. female with a hx of costochondral chest pain with a coronary artery calcium score of 0 and normal CT coronary angiography last seen by me 04/30/2018.  She was seen at [facility] ED 010 2022 with plaint of rapid heart rate heart rate reported at home 160 bpm in the emergency room heart rate was 94 blood pressure 129/79.  Laboratory test were confirmed normal CBC hemoglobin 12.5 potassium 3.8 creatinine 0.5.  TSH is normal troponin undetectable proBNP level was very low less than 20.  Her EKG showed sinus rhythm nonspecific T waves chest x-ray normal it appears she had a myocardial perfusion study done while she was at the hospital it was normal EF 84% normal myocardial perfusion.  Her discharge diagnosis was tachycardia  Compliance with diet, lifestyle  and medications: Yes  She had a patellar fracture and was out of work for a few months less active During the daytime she will notice her watch her heart rate is greater than 120 bpm at rest and she has episodes at night of heart rate in the range of 160 document Fitbit no EKG with that and she is unaware Not having chest pain edema shortness of breath or syncope She takes no over-the-counter proarrhythmic drugs Past Medical History:  Diagnosis Date   Biliary dyskinesia 07/25/2017   Added automatically from request for surgery [id number]   Chest pain in adult 03/18/2018   Fibrocystic breast changes, left 06/10/2017   Intraductal papilloma of breast, 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   Erenumab-aooe (AIMOVIG) 140 MG/ML SOAJ Inject 140 mg into the skin every 30 (thirty) days.     Allergies:   Silicone,  Tape, Codeine, Sulfamethoxazole, and Tramadol   Social History   Socioeconomic History   Marital status: Single    Spouse name: Not on file   Number of children: Not on file   Years of education: Not on file   Highest education level: Not on file  Occupational History   Not on file  Tobacco Use   Smoking status: Never   Smokeless tobacco: Never  Vaping Use   Vaping Use: Never used  Substance and Sexual Activity   Alcohol use: Not Currently   Drug use: Never   Sexual activity: Not on file  Other Topics Concern   Not on file  Social History Narrative   Not on file   Social Determinants of Health   Financial Resource Strain: Not on file  Food Insecurity: Not on  file  Transportation Needs: Not on file  Physical Activity: Not on file  Stress: Not on file  Social Connections: Not on file     Family History: The patient's family history includes Breast cancer in her sister; Cirrhosis in her mother; Diabetes in her mother; Heart attack in her father; Hyperlipidemia in her father. ROS:   Please see the history of present illness.    All other systems reviewed and are negative.  EKGs/Labs/Other Studies Reviewed:    The following studies were reviewed today:  2022 showed a normal TSH  Physical Exam:    VS:  BP 102/78 (BP Location: Right Arm, Patient Position: Sitting)   Pulse 90   Ht '5\' 6"'$  (1.676 m)   Wt 149 lb 5.8 oz (67.7 kg)   SpO2 97%   BMI 24.11 kg/m     Wt Readings from Last 3 Encounters:  03/25/22 149 lb 5.8 oz (67.7 kg)  05/07/21 144 lb (65.3 kg)  12/11/20 143 lb (64.9 kg)     GEN:  Well nourished, well developed in no acute distress HEENT: Normal NECK: No JVD; No carotid bruits LYMPHATICS: No lymphadenopathy CARDIAC: RRR, no murmurs, rubs, gallops RESPIRATORY:  Clear to auscultation without rales, wheezing or rhonchi  ABDOMEN: Soft, non-tender, non-distended MUSCULOSKELETAL:  No edema; No deformity  SKIN: Warm and dry NEUROLOGIC:  Alert and oriented x 3 PSYCHIATRIC:  Normal affect    Signed, Shirlee More, MD  03/25/2022 9:00 AM    [facility]

## 2022-03-25 ENCOUNTER — Ambulatory Visit: Payer: Commercial Managed Care - PPO | Attending: Cardiology | Admitting: Cardiology

## 2022-03-25 ENCOUNTER — Ambulatory Visit: Payer: Commercial Managed Care - PPO | Attending: Cardiology

## 2022-03-25 ENCOUNTER — Encounter: Payer: Self-pay | Admitting: Cardiology

## 2022-03-25 VITALS — BP 102/78 | HR 90 | Ht 66.0 in | Wt 149.4 lb

## 2022-03-25 DIAGNOSIS — R000 Tachycardia, unspecified: Secondary | ICD-10-CM | POA: Diagnosis not present

## 2022-03-25 DIAGNOSIS — E785 Hyperlipidemia, unspecified: Secondary | ICD-10-CM

## 2022-03-25 NOTE — Patient Instructions (Addendum)
Medication Instructions:  Your physician recommends that you continue on your current medications as directed. Please refer to the Current Medication list given to you today.  *If you need a refill on your cardiac medications before your next appointment, please call your pharmacy*   Lab Work: NONE If you have labs (blood work) drawn today and your tests are completely normal, you will receive your results only by: [place] (if you have MyChart) OR A paper copy in the mail If you have any lab test that is abnormal or we need to change your treatment, we will call you to review the results.   Testing/Procedures: You have been asked to wear a Zio Heart Monitor today. It is to be worn for 7 days. Please remove the monitor on Feb. 5th  and mail back in the box provided.  If you have any questions about the monitor please call the company at [phone]     Follow-Up: At [facility], you and your health needs are our priority.  As part of our continuing mission to provide you with exceptional heart care, we have created designated Provider Care Teams.  These Care Teams include your primary Cardiologist (physician) and Advanced Practice Providers (APPs -  Physician Assistants and Nurse Practitioners) who all work together to provide you with the care you need, when you need it.  We recommend signing up for the patient portal called "MyChart".  Sign up information is provided on this After Visit Summary.  MyChart is used to connect with patients for Virtual Visits (Telemedicine).  Patients are able to view lab/test results, encounter notes, upcoming appointments, etc.  Non-urgent messages can be sent to your provider as well.   To learn more about what you can do with MyChart, go to [email or web address].    Your next appointment:   3 month(s)  Provider:   Shirlee More, MD    Other Instructions  1. Avoid all over-the-counter antihistamines except Claritin/Loratadine and  Zyrtec/Cetrizine. 2. Avoid all combination including cold sinus allergies flu decongestant and sleep medications 3. You can use Robitussin DM Mucinex and Mucinex DM for cough. 4. can use Tylenol aspirin ibuprofen and naproxen but no combinations such as sleep or sinus.

## 2022-04-12 ENCOUNTER — Telehealth: Payer: Self-pay | Admitting: *Deleted

## 2022-04-12 NOTE — Telephone Encounter (Signed)
-----   Message from Richardo Priest, MD sent at 04/12/2022  2:42 PM EST ----- Monitor is normal.  If she continues to have symptoms and she capture on her Fitbit if not she might when he has the mobile [place] device.

## 2022-04-12 NOTE — Telephone Encounter (Signed)
Let pt know that the results of her monitor are all normal. Pt verbalized understanding and had no further questions.

## 2022-05-06 NOTE — Patient Instructions (Incomplete)
Below is our plan:  We will switch Amovig to [employer] Continue 1 injection every 30 days. We can try Zavzpret for abortive therapy. 1 spray in nostril at onset of migraine per day as needed. Try to decrease caffeine. Drink more water.   Please make sure you are staying well hydrated. I recommend 50-60 ounces daily. Well balanced diet and regular exercise encouraged. Consistent sleep schedule with 6-8 hours recommended.   Please continue follow up with care team as directed.   Follow up with me in 6 months   You may receive a survey regarding today's visit. I encourage you to leave honest feed back as I do use this information to improve patient care. Thank you for seeing me today!

## 2022-05-06 NOTE — Progress Notes (Unsigned)
No chief complaint on file.   HISTORY OF PRESENT ILLNESS:  05/06/22 ALL:  Mackenzie Burns returns for follow up for migraines. She was last seen 04/2021. She had just started Ajovy due to insurance preference. She called 06/2021 reporting that [place] worked better and wished to switch back. Since,   05/07/2021 ALL: Mackenzie Burns returns for follow up for headaches/migraines. We started Amovig in 11/2020. She was doing well but could not obtain refills due to supply issues. CGRP was switched to Ajovy due to insurance preference. She started Ajovy last week. She tolerated injection. Migraines had nearly resolved on Amovig. She had one mild headache on Amovig that resolved without abortive meds.   12/11/2020 ALL: Mackenzie Burns is a 52 y.o. female here today for follow up for chronic daily headaches. MRI was unremarkable. HST 06/16/2020 showed mild OSA with AHI of 6/hr. She was referred to Dr Ron Parker for consideration of oral appliance. She has not reached out for an appt. Topiramate was increased to '75mg'$  daily. She did not feel this was helping and stopped taking it. Nurtec did not help at all. She reports that the only thing that helps her headaches is pain medication like hydrocodone. She does not drink water. She drinks multiple Pepsi drinks daily. She eats regular meals. She sleeps very well.    Medications tried and failed: topiramate (ineffective), amitriptyline (sleepiness), propranolol contraindicated due to low BP Sumatriptan, rizatriptan, Nurtec, Ubrelvy, Nsaids, Fioricet    HISTORY (copied from Dr Guadelupe Sabin previous note)  Dear Mackenzie Burns,    I saw your patient, Mackenzie Burns, upon your kind request, in my Neurologic clinic today for initial consultation of her headaches, concern for migraines.  The patient is unaccompanied today.  As you know, Mackenzie Burns is a 52 year old right-handed woman with an underlying medical history of thyroid nodule, hyperlipidemia, back pain, shoulder pain on the right,  fibrocystic disease of the breast, and biliary dyskinesia, who reports a history of migraines for about 20 years.  She has previously never seen a neurologist but has tried multiple medications.  In the past 3 months her headaches are more daily, constant, and global.  Migraine attacks have been throbbing and one-sided in the past.  She has had visual auras but not recently.  She currently reports a constant and nearly daily headache.  She has had nausea, no vomiting.  She denies any neurological accompaniments such as numbness or tingling or droopy face or slurring of speech, never had any sudden onset of neurological symptoms.  She had a recent follow-up appointment and blood work through your office on 05/22/2020, we will request test results.  She was also advised to increase her Topamax to 50 mg at bedtime.  She tolerates that, she has not had any telltale improvement yet to her headache.  She reports a headache of 10 out of 10 today. I reviewed your office note from 04/28/2020.  She has tried several preventative and acute medications including over-the-counter nonsteroidal anti-inflammatory medications, acetaminophen, Nurtec, Ubrelvy, and sumatriptan hand, rizatriptan and Fioricet.  She has been on amitriptyline more recently which made her sleepy at 25 mg strength.  In early March she was started on low-dose topiramate 25 mg at bedtime.  She has had light sensitivity, she does not typically have sound sensitivity.  She reports that chronic back pain has exacerbated her headaches.  She has seen neurosurgery for her back in the past and has scoliosis.  She has tried Percocet in the past which helped her back pain  and also her headaches.  She has tried tramadol which caused nausea and vomiting.  She sleeps fairly well, bedtime is around 10 and rise time around 530, she has nocturia about once or twice per average night, she suspects that her father has sleep apnea.  She has never had a sleep study.  She snores  some.  She drinks caffeine in the form of a, 3-4 bottles per day on average, she does not typically drink any water.  She does not drink alcohol, she quit smoking in 2007.  She had a tonsillectomy in 1999.  She had an MRI some 15 years ago and it was reportedly normal at the time.  Her Epworth sleepiness score is 2 out of 24.   REVIEW OF SYSTEMS: Out of a complete 14 system review of symptoms, the patient complains only of the following symptoms, none , and all other reviewed systems are negative.   ALLERGIES: Allergies  Allergen Reactions   Silicone Other (See Comments)    Plastic tape & tegaderm - breaks skin out (paper tape ok)   Tape Other (See Comments)    Plastic tape & tegaderm - breaks skin out (paper tape ok)   Codeine Nausea And Vomiting   Sulfamethoxazole Hives and Rash   Tramadol Nausea And Vomiting     HOME MEDICATIONS: Outpatient Medications Prior to Visit  Medication Sig Dispense Refill   atorvastatin (LIPITOR) 10 MG tablet Take 10 mg by mouth at bedtime.     Erenumab-aooe (AIMOVIG) 140 MG/ML SOAJ Inject 140 mg into the skin every 30 (thirty) days. 3 mL 3   No facility-administered medications prior to visit.     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   Right upper quadrant abdominal pain 08/17/2015     PAST SURGICAL HISTORY: Past Surgical History:  Procedure Laterality Date   APPENDECTOMY     BREAST SURGERY     papaloma removed   CHOLECYSTECTOMY     FOOT SURGERY     HERNIA REPAIR     TONSILLECTOMY     TOTAL ABDOMINAL HYSTERECTOMY     TUBAL LIGATION     WISDOM TOOTH EXTRACTION       FAMILY HISTORY: Family History  Problem Relation Age of Onset   Cirrhosis Mother    Diabetes Mother    Heart attack Father     Hyperlipidemia Father    Breast cancer Sister      SOCIAL HISTORY: Social History   Socioeconomic History   Marital status: Single    Spouse name: Not on file   Number of children: Not on file   Years of education: Not on file   Highest education level: Not on file  Occupational History   Not on file  Tobacco Use   Smoking status: Never   Smokeless tobacco: Never  Vaping Use   Vaping Use: Never used  Substance and Sexual Activity   Alcohol use: Not Currently   Drug use: Never   Sexual activity: Not on file  Other Topics Concern   Not on file  Social History Narrative   Not on file   Social Determinants of Health   Financial Resource Strain: Not on file  Food Insecurity: Not on file  Transportation Needs: Not on file  Physical  Activity: Not on file  Stress: Not on file  Social Connections: Not on file  Intimate Partner Violence: Not on file     PHYSICAL EXAM  There were no vitals filed for this visit.  There is no height or weight on file to calculate BMI.  Generalized: Well developed, in no acute distress  Cardiology: normal rate and rhythm, no murmur auscultated  Respiratory: clear to auscultation bilaterally    Neurological examination  Mentation: Alert oriented to time, place, history taking. Follows all commands speech and language fluent Cranial nerve II-XII: Pupils were equal round reactive to light. Extraocular movements were full, visual field were full on confrontational test. Facial sensation and strength were normal. Head turning and shoulder shrug  were normal and symmetric. Motor: The motor testing reveals 5 over 5 strength of all 4 extremities. Good symmetric motor tone is noted throughout.  Gait and station: Gait is normal.   DIAGNOSTIC DATA (LABS, IMAGING, TESTING) - I reviewed patient records, labs, notes, testing and imaging myself where available.  No results found for: "WBC", "HGB", "HCT", "MCV", "PLT"    Component Value Date/Time    NA 140 04/03/2018 1424   K 3.8 04/03/2018 1424   CL 102 04/03/2018 1424   CO2 25 04/03/2018 1424   GLUCOSE 95 04/03/2018 1424   BUN 9 04/03/2018 1424   CREATININE 0.56 (L) 04/03/2018 1424   CALCIUM 9.2 04/03/2018 1424   GFRNONAA 111 04/03/2018 1424   GFRAA 128 04/03/2018 1424   No results found for: "CHOL", "HDL", "LDLCALC", "LDLDIRECT", "TRIG", "CHOLHDL" No results found for: "HGBA1C" No results found for: "VITAMINB12" No results found for: "TSH"      No data to display               No data to display           ASSESSMENT AND PLAN  52 y.o. year old female  has a past medical history of Biliary dyskinesia (07/25/2017), Chest pain in adult (03/18/2018), Fibrocystic breast changes, left (06/10/2017), Intraductal papilloma of breast, left (04/29/2016), and Right upper quadrant abdominal pain (08/17/2015). here with    No diagnosis found.  Dazia reports that headaches have nearly resolved. She could not get Amovig but was able to get Ajovy and has tolerated first injection. I will have her continue monthly. Healthy lifestyle habits encouraged. She will follow up with me in 1 year, sooner if needed. She verbalizes understanding and agreement with this plan.   No orders of the defined types were placed in this encounter.     No orders of the defined types were placed in this encounter.      Debbora Presto, MSN, FNP-C 05/06/2022, 4:47 PM  [facility] [address], [place], [place] [address] [phone]

## 2022-05-08 ENCOUNTER — Encounter: Payer: Self-pay | Admitting: Family Medicine

## 2022-05-08 ENCOUNTER — Ambulatory Visit: Payer: Commercial Managed Care - PPO | Admitting: Family Medicine

## 2022-05-08 VITALS — BP 113/68 | HR 93 | Ht 66.0 in | Wt 151.8 lb

## 2022-05-08 DIAGNOSIS — G43E09 Chronic migraine with aura, not intractable, without status migrainosus: Secondary | ICD-10-CM | POA: Diagnosis not present

## 2022-05-08 MED ORDER — EMGALITY 120 MG/ML ~~LOC~~ SOAJ: 120.0000 mg | SUBCUTANEOUS | 3 refills | Status: DC

## 2022-05-08 MED ORDER — ZAVZPRET 10 MG/ACT NA SOLN: 1.0000 | Freq: Every day | NASAL | 0 refills | Status: DC | PRN

## 2022-05-08 MED ORDER — ZAVZPRET 10 MG/ACT NA SOLN: 1.0000 | Freq: Every day | NASAL | 11 refills | Status: DC

## 2022-06-23 NOTE — Progress Notes (Unsigned)
Cardiology Office Note:    Date:  06/25/2022   ID:  Mackenzie Burns, DOB [birth year], MRN [id number]  PCP:  Ailene Ravel, MD  Cardiologist:  Norman Herrlich, MD    Referring MD: Ailene Ravel, MD    ASSESSMENT:    1. Tachycardia with heart rate 141-160 beats per minute   2. Costochondral pain   3. Dyslipidemia    PLAN:    In order of problems listed above:  After evaluation I think over capturing here is sinus tachycardia and not atrial arrhythmia she is asymptomatic I would not put her on suppressive treatment she could use a mobile Kardia to capture episodes in the future he can communicate through MyChart Presently not having chest pain Continue her statin   Next appointment: As needed   Medication Adjustments/Labs and Tests Ordered: Current medicines are reviewed at length with the patient today.  Concerns regarding medicines are outlined above.  No orders of the defined types were placed in this encounter.  No orders of the defined types were placed in this encounter.   Follow-up monitor   History of Present Illness:    Mackenzie Burns is a 52 y.o. 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  She had an event monitor reported 04/12/2022 with rare ventricular and supraventricular ectopy 2 brief episodes of atrial tachycardia were present the longest 10 complexes rate of 133 bpm no episodes of atrial fibrillation or flutter.  Compliance with diet, lifestyle and medications: Yes   She is not having palpitation but her watch tells her at times her heart rates are greater than 130 I reviewed her monitor with her she has no significant arrhythmia and not uncommonly normal individuals have brief episodes of atrial tachycardia. 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   celecoxib (CELEBREX) 200 MG capsule Take 200 mg by mouth daily.   Galcanezumab-gnlm (EMGALITY) 120 MG/ML SOAJ Inject 120 mg into the skin every 30 (thirty) days.   Zavegepant HCl (ZAVZPRET) 10 MG/ACT SOLN Place 1 spray into the nose daily.     Allergies:   Silicone, Tape, Codeine, Sulfamethoxazole, and Tramadol   Social History   Socioeconomic History   Marital status: Single    Spouse name: Not on file   Number of children: Not on file  Years of education: Not on file   Highest education level: Not on file  Occupational History   Not on file  Tobacco Use   Smoking status: Never   Smokeless tobacco: Never  Vaping Use   Vaping Use: Never used  Substance and Sexual Activity   Alcohol use: Not Currently   Drug use: Never   Sexual activity: Not on file  Other Topics Concern   Not on file  Social History Narrative   Not on file   Social Determinants of Health   Financial Resource Strain: Not on file  Food Insecurity: Not on file  Transportation Needs: Not on file  Physical Activity: Not on file  Stress: Not on file  Social  Connections: Not on file     Family History: The patient's family history includes Breast cancer in her sister; Cirrhosis in her mother; Diabetes in her mother; Heart attack in her father; Hyperlipidemia in her father. ROS:   Please see the history of present illness.    All other systems reviewed and are negative.  EKGs/Labs/Other Studies Reviewed:    The following studies were reviewed today:  Cardiac Studies & Procedures     STRESS TESTS  MYOCARDIAL PERFUSION IMAGING 04/05/2022     MONITORS  LONG TERM MONITOR (3-14 DAYS) 04/12/2022  Narrative Patch Wear Time:  7 days and 8 hours (2024-01-29T09:07:04-498 to 2024-02-05T18:04:23-0500)  Patient had a min HR of 61 bpm, max HR of 156 bpm, and avg HR of 92 bpm. Predominant underlying rhythm was Sinus Rhythm.  There were no pauses of 3 seconds or greater no episodes of second or third-degree AV nodal block.  There were 5 triggered events all sinus rhythm or sinus tachycardia with rates of up to 130 bpm.  2 Supraventricular Tachycardia runs occurred, the run with the fastest interval lasting 10 beats with a max rate of 156 bpm (avg 133 bpm); the run with the fastest interval was also the longest.  There were no episodes of atrial fibrillation or flutter.  Isolated SVEs were rare (<1.0%), SVE Couplets were rare (<1.0%), and SVE Triplets were rare (<1.0%).  Isolated VEs were rare (<1.0%), and no VE Couplets or VE Triplets were present.   CT SCANS  CT CORONARY MORPH W/CTA COR W/SCORE 04/09/2018  Addendum 04/09/2018  1:19 PM ADDENDUM REPORT: 04/09/2018 13:17  CLINICAL DATA:  52 year old female with chest pain.  EXAM: Cardiac/Coronary  CT  TECHNIQUE: The patient was scanned on a [employer].  FINDINGS: A 120 kV prospective scan was triggered in the descending thoracic aorta at 111 HU's. Axial non-contrast 3 mm slices were carried out through the heart. The data set was analyzed on a dedicated work station and  scored using the Agatson method. Gantry rotation speed was 250 msecs and collimation was .6 mm. No beta blockade and 0.8 mg of sl NTG was given. The 3D data set was reconstructed in 5% intervals of the 67-82 % of the R-R cycle. Diastolic phases were analyzed on a dedicated work station using MPR, MIP and VRT modes. The patient received 80 cc of contrast.  Aorta:  Normal size.  No calcifications.  No dissection.  Aortic Valve:  Trileaflet.  No calcifications.  Coronary Arteries:  Normal coronary origin.  Right dominance.  RCA is a large dominant artery that gives rise to PDA and PLVB. There is no plaque.  Left main is a large artery that gives rise to LAD and LCX arteries. Left main has no plaque.  LAD  is a large vessel that gives rise to two diagonal arteries and has no plaque.  LCX is a non-dominant artery that gives rise to one large OM1 branch. There is no plaque.  Other findings:  Normal pulmonary vein drainage into the left atrium.  Normal let atrial appendage without a thrombus.  Normal size of the pulmonary artery.  IMPRESSION: 1. Coronary calcium score of 0. This was 0 percentile for age and sex matched control.  2. Normal coronary origin with right dominance.  3. No evidence of CAD.   Electronically Signed By: Tobias Alexander On: 04/09/2018 13:17  Narrative EXAM: OVER-READ INTERPRETATION  CT CHEST  The following report is an over-read performed by radiologist Dr. Trudie Reed of [facility] on 04/09/2018. This over-read does not include interpretation of cardiac or coronary anatomy or pathology. The coronary calcium score/coronary CTA interpretation by the cardiologist is attached.  COMPARISON:  None.  FINDINGS: Within the visualized portions of the thorax there are no suspicious appearing pulmonary nodules or masses, there is no acute consolidative airspace disease, no pleural effusions, no pneumothorax and no lymphadenopathy.  Visualized portions of the upper abdomen are unremarkable. There are no aggressive appearing lytic or blastic lesions noted in the visualized portions of the skeleton.  IMPRESSION: No significant incidental noncardiac findings are noted.  Electronically Signed: By: Trudie Reed M.D. On: 04/09/2018 11:16          Recent Labs: 05/24/2021 cholesterol 156 LDL 98  Physical Exam:    VS:  BP 108/70 (BP Location: Right Arm, Patient Position: Sitting, Cuff Size: Normal)   Pulse 74   Ht 5\' 6"  (1.676 m)   Wt 150 lb (68 kg)   SpO2 98%   BMI 24.21 kg/m     Wt Readings from Last 3 Encounters:  06/25/22 150 lb (68 kg)  05/08/22 151 lb 12.8 oz (68.9 kg)  03/25/22 149 lb 5.8 oz (67.7 kg)     GEN:  Well nourished, well developed in no acute distress HEENT: Normal NECK: No JVD; No carotid bruits LYMPHATICS: No lymphadenopathy CARDIAC: RRR, no murmurs, rubs, gallops RESPIRATORY:  Clear to auscultation without rales, wheezing or rhonchi  ABDOMEN: Soft, non-tender, non-distended MUSCULOSKELETAL:  No edema; No deformity  SKIN: Warm and dry NEUROLOGIC:  Alert and oriented x 3 PSYCHIATRIC:  Normal affect    Signed, Norman Herrlich, MD  06/25/2022 8:00 AM    [facility]

## 2022-06-25 ENCOUNTER — Encounter: Payer: Self-pay | Admitting: Cardiology

## 2022-06-25 ENCOUNTER — Ambulatory Visit: Payer: Commercial Managed Care - PPO | Attending: Cardiology | Admitting: Cardiology

## 2022-06-25 VITALS — BP 108/70 | HR 74 | Ht 66.0 in | Wt 150.0 lb

## 2022-06-25 DIAGNOSIS — R000 Tachycardia, unspecified: Secondary | ICD-10-CM

## 2022-06-25 DIAGNOSIS — E785 Hyperlipidemia, unspecified: Secondary | ICD-10-CM

## 2022-06-25 DIAGNOSIS — R0789 Other chest pain: Secondary | ICD-10-CM

## 2022-06-25 NOTE — Patient Instructions (Signed)
Medication Instructions:  Your physician recommends that you continue on your current medications as directed. Please refer to the Current Medication list given to you today.  *If you need a refill on your cardiac medications before your next appointment, please call your pharmacy*   Lab Work: None If you have labs (blood work) drawn today and your tests are completely normal, you will receive your results only by: MyChart Message (if you have MyChart) OR A paper copy in the mail If you have any lab test that is abnormal or we need to change your treatment, we will call you to review the results.   Testing/Procedures: None   Follow-Up: At [facility], you and your health needs are our priority.  As part of our continuing mission to provide you with exceptional heart care, we have created designated Provider Care Teams.  These Care Teams include your primary Cardiologist (physician) and Advanced Practice Providers (APPs -  Physician Assistants and Nurse Practitioners) who all work together to provide you with the care you need, when you need it.  We recommend signing up for the patient portal called "MyChart".  Sign up information is provided on this After Visit Summary.  MyChart is used to connect with patients for Virtual Visits (Telemedicine).  Patients are able to view lab/test results, encounter notes, upcoming appointments, etc.  Non-urgent messages can be sent to your provider as well.   To learn more about what you can do with MyChart, go to [email or web address].    Your next appointment:   Follow up as needed  Provider:   Norman Herrlich, MD    Other Instructions Set- up high rate limit of 130 and capture with mobile kardia and send strips via MyChart.

## 2022-07-25 ENCOUNTER — Other Ambulatory Visit (HOSPITAL_COMMUNITY): Payer: Self-pay

## 2022-07-25 ENCOUNTER — Telehealth: Payer: Self-pay

## 2022-07-25 NOTE — Telephone Encounter (Signed)
Pharmacy Patient Advocate Encounter   Received notification from [facility] that prior authorization for Emgality 120MG /ML auto-injectors (migraine) is required/requested.   PA submitted to [facility] via CoverMyMeds Key or (Medicaid) confirmation # [id number] Status is pending

## 2022-07-25 NOTE — Telephone Encounter (Signed)
Pharmacy Patient Advocate Encounter   Received notification from [facility] that prior authorization for Zavzpret 10MG /ACT solution is required/requested.   PA submitted to [facility] via CoverMyMeds Key or [facility]) confirmation # [id number] Status is pending

## 2022-07-27 NOTE — Telephone Encounter (Signed)
Patient Advocate Encounter  Prior Authorization for Emgality 120MG /ML auto-injectors (migraine) has been approved.    PA# [id number] Insurance [employer] Electronic Prior Authorization Form Effective dates: 07/25/2022 through 01/25/2023

## 2022-08-02 NOTE — Telephone Encounter (Signed)
Pharmacy Patient Advocate Encounter  Received notification from [employer] that the request for prior authorization for Zavzpret 10MG /ACT solution has been denied due to see below.      Please be advised we currently do not have a Pharmacist to review denials, therefore you will need to process appeals accordingly as needed. Thanks for your support at this time.   You may call [phone] or fax [phone], to appeal.  There is also an option to do an Eappeal on CMM  Case ID: [id number]  The denial letter has been placed in chart under the media tab.

## 2022-08-05 ENCOUNTER — Other Ambulatory Visit (HOSPITAL_COMMUNITY): Payer: Self-pay

## 2022-08-05 ENCOUNTER — Telehealth: Payer: Self-pay

## 2022-08-05 NOTE — Telephone Encounter (Signed)
Pharmacy Patient Advocate Encounter   Received notification from [facility] that prior authorization for Zavzpret 10MG /ACT solution is required/requested.   PA submitted to [facility] via CoverMyMeds Key or (Medicaid) confirmation # [id number] Status is pending

## 2022-08-05 NOTE — Telephone Encounter (Signed)
A new telephone call encounter has been made for this PA Request-please see telephone note dated .Marland KitchenMarland Kitchen6/11/2022.

## 2022-08-05 NOTE — Telephone Encounter (Signed)
Monica- can you resubmit PA? I reviewed notes and there is documentation that she had SE from amitriptyline (sleepiness), topamax was ineffective and BP meds contraindicated d/t low BP.

## 2022-08-07 ENCOUNTER — Other Ambulatory Visit (HOSPITAL_COMMUNITY): Payer: Self-pay

## 2022-08-07 MED ORDER — EMGALITY 120 MG/ML ~~LOC~~ SOAJ: 120.0000 mg | SUBCUTANEOUS | 3 refills | Status: DC

## 2022-08-07 NOTE — Telephone Encounter (Signed)
I spoke with the patient to inform her that the medication has been approved. She reported that she had not received the Emgality that was previously approved. I will send out the medication for her today.

## 2022-08-07 NOTE — Telephone Encounter (Signed)
Pharmacy Patient Advocate Encounter  Prior Authorization for Zavzpret 10MG /ACT solution has been APPROVED by [employer] from 08/05/2022 to 11/05/2022.   PA # PA Case ID: [id number]  Copay is $0 per [facility] test claim.

## 2022-09-25 ENCOUNTER — Telehealth: Payer: Self-pay | Admitting: Family Medicine

## 2022-09-25 NOTE — Telephone Encounter (Signed)
LVM and sent mychart msg informing pt of need to reschedule 11/13/22 appt - NP out

## 2022-09-25 NOTE — Telephone Encounter (Signed)
Pt reporting that Galcanezumab-gnlm (EMGALITY) 120 MG/ML SOAJ , has not helped.  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

## 2022-09-25 NOTE — Telephone Encounter (Signed)
Pt said Amovig was working until it stopped working that's why she switched to [employer]. Pt declined visit, states she will follow up at visit already scheduled on 11/20/22

## 2022-09-25 NOTE — Telephone Encounter (Signed)
I called patient to discuss the below. Pt said she has tried everything for migraines and none thing seems to help. Pt said she took Emgality 2 weeks ago and she her head still hurts. She reports she has tried topamax, Ajovy, Aimovig, Amitriptyline. Pt is seeking long-term relief medication management. Pt states she never received the Zavzpret nasal spray, [facility] never sent medication and pt never call them to find out why.  She asked if you have any other thoughts? Please advise

## 2022-10-11 ENCOUNTER — Telehealth: Payer: Self-pay

## 2022-10-11 NOTE — Telephone Encounter (Signed)
Pharmacy Patient Advocate Encounter   Received notification from CoverMyMeds that prior authorization for Zavzpret 10MG /ACT solution is required/requested.   Insurance verification completed.   The patient is insured through [facility] .   Per test claim: PA required; PA submitted to [facility] via CoverMyMeds Key/confirmation #/EOC [id number] Status is pending

## 2022-10-18 NOTE — Telephone Encounter (Signed)
Pharmacy Patient Advocate Encounter  Received notification from [facility] that Prior Authorization for Zavzpret 10MG /ACT solution has been APPROVED from 10/11/2022 to 10/11/2023.   PA #/Case ID/Reference #: PA Case ID #: [id number]

## 2022-11-13 ENCOUNTER — Ambulatory Visit: Payer: Commercial Managed Care - PPO | Admitting: Family Medicine

## 2022-11-20 ENCOUNTER — Ambulatory Visit: Payer: Commercial Managed Care - PPO | Admitting: Family Medicine

## 2023-01-31 ENCOUNTER — Telehealth: Payer: Self-pay

## 2023-01-31 ENCOUNTER — Other Ambulatory Visit (HOSPITAL_COMMUNITY): Payer: Self-pay

## 2023-01-31 NOTE — Telephone Encounter (Signed)
Pharmacy Patient Advocate Encounter   Received notification from CoverMyMeds that prior authorization for Emgality 120MG /ML auto-injectors (migraine) is required/requested.   Insurance verification completed.   The patient is insured through [facility] .   Per test claim: PA required; PA submitted to above mentioned insurance via CoverMyMeds Key/confirmation #/EOC [id number] Status is pending

## 2023-02-07 NOTE — Telephone Encounter (Signed)
Pharmacy Patient Advocate Encounter  Received notification from [facility] that Prior Authorization for Emgality 120MG /ML auto-injectors (migraine) has been DENIED.  Full denial letter will be uploaded to the media tab. See denial reason below.   PA #/Case ID/Reference #: [id number]

## 2023-02-10 NOTE — Telephone Encounter (Signed)
Looks like pt tried ajovy back in 05-07-2021

## 2023-02-11 NOTE — Telephone Encounter (Signed)
I called pt and she relayed she has tried everything.  Ajovy Aimovig, nurtec quilipta, nothing works.  She has been on emgality now for 4 months.  I relayed that insurance has denied emgality. She said her pcp referred her to Spine specialist at [facility] 02-20-2023 for MRI neck and back.  She has appt with [place] 04-28-2023.

## 2023-02-11 NOTE — Telephone Encounter (Signed)
Denial states PT must have tried for at least 12 weeks unless contraindication or side effects. It looks like PT was unable to get the Ajovy form the pharmacy-that may be why the Ajovy did not count.Nurtec was tried but could not find if it was tried as a preventative and also Bennie Pierini has not been tried.

## 2023-04-23 NOTE — Progress Notes (Signed)
 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 She had been switched from Amovig to Ajovy per insurance preference 03/2021. After 3 injections, she called reporting it was ineffective and switched back to Amovig. She was last seen 04/2022 and doing well on Amovig but concerned of elevated heart rate and was switched to [employer]. She reported 08/2022 that Emgality was no longer effective but declined visit to discuss. We received notification 01/2023 that Emgality was no loner covered as she had not failed Qulipta.   She is having a headache of some sort every day. She describes a pounding sensation, usually bilateral. Some nausea. She has light sensitivity at times. Maybe 6-8 migraines per month. Tylenol and Aleve are ineffective. She is nervous to consider Botox. Zavzpret approved but she never received from pharmacy.   She is following with [facility] for back pain. Was discussing steroid injections but was told she may have fibromyalgia.   Medications tried and failed: Emgality (not effective), Ajovy (not effective), Amovig (? increased heart rate), topiramate (ineffective), amitriptyline (sleepiness), propranolol contraindicated due to low BP, Sumatriptan, rizatriptan, Nurtec, Ubrelvy, Nsaids, Fioricet   05/08/2022 ALL:  Mackenzie Burns returns for follow up for migraines. She was last seen 04/2021. She had just started Ajovy due to insurance preference. She called 06/2021 reporting that Amovig worked better and wished to switch back. Since, she reports doing fairly well. She has about 3-4 migraines per month. She has failed multiple abortive meds that were not effective. She is being followed by cardiology for tachycardia. Heart montior reportedly normal. She is concerned Amovig could be contributing. She admits to drinking at least 1 16 ounce  soda daily. Little water.   05/07/2021 ALL: Mackenzie Burns returns for follow up for headaches/migraines. We started Amovig in 11/2020. She was doing well but could not obtain refills due to supply issues. CGRP was switched to Ajovy due to insurance preference. She started Ajovy last week. She tolerated injection. Migraines had nearly resolved on Amovig. She had one mild headache on Amovig that resolved without abortive meds.   12/11/2020 ALL: Mackenzie Burns is a 53 y.o. female here today for follow up for chronic daily headaches. MRI was unremarkable. HST 06/16/2020 showed mild OSA with AHI of 6/hr. She was referred to Dr Myrtis Ser for consideration of oral appliance. She has not reached out for an appt. Topiramate was increased to 75mg  daily. She did not feel this was helping and stopped taking it. Nurtec did not help at all. She reports that the only thing that helps her headaches is pain medication like hydrocodone. She does not drink water. She drinks multiple Pepsi drinks daily. She eats regular meals. She sleeps very well.    Medications tried and failed: topiramate (ineffective), amitriptyline (sleepiness), propranolol contraindicated due to low BP Sumatriptan, rizatriptan, Nurtec, Ubrelvy, Nsaids, Fioricet    HISTORY (copied from Dr Teofilo Pod previous note)  Dear Mackenzie Burns,    I saw your patient, Mackenzie Burns, upon your kind request, in my Neurologic clinic today for initial consultation of her headaches, concern for migraines.  The patient is unaccompanied today.  As you know, Mackenzie Burns is a 53 year old right-handed woman with an underlying medical history of thyroid nodule, hyperlipidemia, back pain, shoulder pain on the right, fibrocystic disease of the breast, and biliary dyskinesia, who reports a history of migraines for about 20  years.  She has previously never seen a neurologist but has tried multiple medications.  In the past 3 months her headaches are more daily, constant, and global.  Migraine  attacks have been throbbing and one-sided in the past.  She has had visual auras but not recently.  She currently reports a constant and nearly daily headache.  She has had nausea, no vomiting.  She denies any neurological accompaniments such as numbness or tingling or droopy face or slurring of speech, never had any sudden onset of neurological symptoms.  She had a recent follow-up appointment and blood work through your office on 05/22/2020, we will request test results.  She was also advised to increase her Topamax to 50 mg at bedtime.  She tolerates that, she has not had any telltale improvement yet to her headache.  She reports a headache of 10 out of 10 today. I reviewed your office note from 04/28/2020.  She has tried several preventative and acute medications including over-the-counter nonsteroidal anti-inflammatory medications, acetaminophen, Nurtec, Ubrelvy, and sumatriptan hand, rizatriptan and Fioricet.  She has been on amitriptyline more recently which made her sleepy at 25 mg strength.  In early March she was started on low-dose topiramate 25 mg at bedtime.  She has had light sensitivity, she does not typically have sound sensitivity.  She reports that chronic back pain has exacerbated her headaches.  She has seen neurosurgery for her back in the past and has scoliosis.  She has tried Percocet in the past which helped her back pain and also her headaches.  She has tried tramadol which caused nausea and vomiting.  She sleeps fairly well, bedtime is around 10 and rise time around 530, she has nocturia about once or twice per average night, she suspects that her father has sleep apnea.  She has never had a sleep study.  She snores some.  She drinks caffeine in the form of a, 3-4 bottles per day on average, she does not typically drink any water.  She does not drink alcohol, she quit smoking in 2007.  She had a tonsillectomy in 1999.  She had an MRI some 15 years ago and it was reportedly normal at the time.   Her Epworth sleepiness score is 2 out of 24.   REVIEW OF SYSTEMS: Out of a complete 14 system review of symptoms, the patient complains only of the following symptoms, chronic pain, low back pain, neck pain, headaches, and all other reviewed systems are negative.   ALLERGIES: Allergies  Allergen Reactions   Silicone Other (See Comments)    Plastic tape & tegaderm - breaks skin out (paper tape ok)   Tape Other (See Comments)    Plastic tape & tegaderm - breaks skin out (paper tape ok)   Codeine Nausea And Vomiting   Sulfamethoxazole Hives and Rash   Tramadol Nausea And Vomiting     HOME MEDICATIONS: Outpatient Medications Prior to Visit  Medication Sig Dispense Refill   atorvastatin (LIPITOR) 10 MG tablet Take 10 mg by mouth at bedtime.     fenofibrate (TRICOR) 145 MG tablet Take 145 mg by mouth daily.     VITAMIN D, CHOLECALCIFEROL, PO Take 400 mg by mouth daily.     celecoxib (CELEBREX) 200 MG capsule Take 200 mg by mouth daily.     Galcanezumab-gnlm (EMGALITY) 120 MG/ML SOAJ Inject 120 mg into the skin every 30 (thirty) days. 3 mL 3   Zavegepant HCl (ZAVZPRET) 10 MG/ACT SOLN Place 1 spray into the  nose daily. 6 each 11   No facility-administered medications prior to visit.     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   Right upper quadrant abdominal pain 08/17/2015     PAST SURGICAL HISTORY: Past Surgical History:  Procedure Laterality Date   APPENDECTOMY     BREAST SURGERY     papaloma removed   CHOLECYSTECTOMY     FOOT SURGERY     HERNIA REPAIR     TONSILLECTOMY     TOTAL ABDOMINAL HYSTERECTOMY     TUBAL LIGATION     WISDOM TOOTH EXTRACTION       FAMILY HISTORY: Family History  Problem Relation Age of Onset   Cirrhosis  Mother    Diabetes Mother    Heart attack Father    Hyperlipidemia Father    Breast cancer Sister      SOCIAL HISTORY: Social History   Socioeconomic History   Marital status: Single    Spouse name: Not on file   Number of children: Not on file   Years of education: Not on file   Highest education level: Not on file  Occupational History   Not on file  Tobacco Use   Smoking status: Never   Smokeless tobacco: Never  Vaping Use   Vaping status: Never Used  Substance and Sexual Activity   Alcohol use: Not Currently   Drug use: Never   Sexual activity: Not on file  Other Topics Concern   Not on file  Social History Narrative   Not on file   Social Drivers of Health   Financial Resource Strain: Not on file  Food Insecurity: Not on file  Transportation Needs: Not on file  Physical Activity: Not on file  Stress: Not on file  Social Connections: Unknown (07/10/2021)   Received from [facility], [facility]   Social Network    Social Network: Not on file  Intimate Partner Violence: Unknown (06/01/2021)   Received from [employer], [facility]   HITS    Physically Hurt: Not on file    Insult or Talk Down To: Not on file    Threaten Physical Harm: Not on file    Scream or Curse: Not on file     PHYSICAL EXAM  Vitals:   04/28/23 1301  BP: 131/83  Resp: 17  Weight: 154 lb (69.9 kg)  Height: 5\' 6"  (1.676 m)     Body mass index is 24.86 kg/m.  Generalized: Well developed, in no acute distress  Cardiology: normal rate and rhythm, no murmur auscultated  Respiratory: clear to auscultation bilaterally    Neurological examination  Mentation: Alert oriented to time, place, history taking. Follows all commands speech and language fluent Cranial nerve II-XII: Pupils were equal round reactive to light. Extraocular movements were full, visual field were full on confrontational test. Facial sensation and strength were normal. Head turning and shoulder shrug  were  normal and symmetric. Motor: The motor testing reveals 5 over 5 strength of all 4 extremities. Good symmetric motor tone is noted throughout.  Gait and station: Gait is normal.   DIAGNOSTIC DATA (LABS, IMAGING, TESTING) - I reviewed patient records, labs, notes, testing and imaging myself where available.  No results found for: "WBC", "HGB", "HCT", "MCV", "PLT"  Component Value Date/Time   NA 140 04/03/2018 1424   K 3.8 04/03/2018 1424   CL 102 04/03/2018 1424   CO2 25 04/03/2018 1424   GLUCOSE 95 04/03/2018 1424   BUN 9 04/03/2018 1424   CREATININE 0.56 (L) 04/03/2018 1424   CALCIUM 9.2 04/03/2018 1424   GFRNONAA 111 04/03/2018 1424   GFRAA 128 04/03/2018 1424   No results found for: "CHOL", "HDL", "LDLCALC", "LDLDIRECT", "TRIG", "CHOLHDL" No results found for: "HGBA1C" No results found for: "VITAMINB12" No results found for: "TSH"      No data to display               No data to display           ASSESSMENT AND PLAN  53 y.o. year old female  has a past medical history of Biliary dyskinesia (07/25/2017), Chest pain in adult (03/18/2018), Fibrocystic breast changes, left (06/10/2017), Intraductal papilloma of breast, left (04/29/2016), and Right upper quadrant abdominal pain (08/17/2015). here with    Chronic migraine with aura without status migrainosus, not intractable  Gwenevere reports that headaches are occurring daily. Approx 6-8 migraines. We will try Qulipta 60mg  daily. Discussed Botox but concerned about injections. We will continue Zavzpret for abortive therapy. Appropriate dosing and possible side effects reviewed. She was advised to request from pharmacy as it was approved 09/2022. Healthy lifestyle habits encouraged. She will follow up with me in 6 months, sooner if needed. She verbalizes understanding and agreement with this plan.   No orders of the defined types were placed in this encounter.     Meds ordered this encounter  Medications   Atogepant  (QULIPTA) 60 MG TABS    Sig: Take 1 tablet (60 mg total) by mouth daily.    Dispense:  90 tablet    Refill:  3    Supervising Provider:   Anson Fret [id number]     I spent 30 minutes of face-to-face and non-face-to-face time with patient.  This included previsit chart review, lab review, study review, order entry, electronic health record documentation, patient education.    Shawnie Dapper, MSN, FNP-C 04/28/2023, 1:52 PM  [facility] [address] [place], [place] [address] [phone]

## 2023-04-23 NOTE — Patient Instructions (Signed)
 Below is our plan:  We will start Qulipta. Take 60mg  daily. Call [facility] for shipment of Zavzpret. Use this as needed to stop migraine.   Please make sure you are staying well hydrated. I recommend 50-60 ounces daily. Well balanced diet and regular exercise encouraged. Consistent sleep schedule with 6-8 hours recommended.   Please continue follow up with care team as directed.   Follow up with me in 6 months   You may receive a survey regarding today's visit. I encourage you to leave honest feed back as I do use this information to improve patient care. Thank you for seeing me today!   GENERAL HEADACHE INFORMATION:   Natural supplements: Magnesium Oxide or Magnesium Glycinate 500 mg at bed (up to 800 mg daily) Coenzyme Q10 300 mg in AM Vitamin B2- 200 mg twice a day   Add 1 supplement at a time since even natural supplements can have undesirable side effects. You can sometimes buy supplements cheaper (especially Coenzyme Q10) at www.WebmailGuide.co.za or at [facility].  Migraine with aura: There is increased risk for stroke in women with migraine with aura and a contraindication for the combined contraceptive pill for use by women who have migraine with aura. The risk for women with migraine without aura is lower. However other risk factors like smoking are far more likely to increase stroke risk than migraine. There is a recommendation for no smoking and for the use of OCPs without estrogen such as progestogen only pills particularly for women with migraine with aura.Marland Kitchen People who have migraine headaches with auras may be 3 times more likely to have a stroke caused by a blood clot, compared to migraine patients who don't see auras. Women who take hormone-replacement therapy may be 30 percent more likely to suffer a clot-based stroke than women not taking medication containing estrogen. Other risk factors like smoking and high blood pressure may be  much more important.    Vitamins and herbs that show  potential:   Magnesium: Magnesium (250 mg twice a day or 500 mg at bed) has a relaxant effect on smooth muscles such as blood vessels. Individuals suffering from frequent or daily headache usually have low magnesium levels which can be increase with daily supplementation of 400-750 mg. Three trials found 40-90% average headache reduction  when used as a preventative. Magnesium may help with headaches are aura, the best evidence for magnesium is for migraine with aura is its thought to stop the cortical spreading depression we believe is the pathophysiology of migraine aura.Magnesium also demonstrated the benefit in menstrually related migraine.  Magnesium is part of the messenger system in the serotonin cascade and it is a good muscle relaxant.  It is also useful for constipation which can be a side effect of other medications used to treat migraine. Good sources include nuts, whole grains, and tomatoes. Side Effects: loose stool/diarrhea  Riboflavin (vitamin B 2) 200 mg twice a day. This vitamin assists nerve cells in the production of ATP a principal energy storing molecule.  It is necessary for many chemical reactions in the body.  There have been at least 3 clinical trials of riboflavin using 400 mg per day all of which suggested that migraine frequency can be decreased.  All 3 trials showed significant improvement in over half of migraine sufferers.  The supplement is found in bread, cereal, milk, meat, and poultry.  Most Americans get more riboflavin than the recommended daily allowance, however riboflavin deficiency is not necessary for the supplements to  help prevent headache. Side effects: energizing, green urine   Coenzyme Q10: This is present in almost all cells in the body and is critical component for the conversion of energy.  Recent studies have shown that a nutritional supplement of CoQ10 can reduce the frequency of migraine attacks by improving the energy production of cells as with  riboflavin.  Doses of 150 mg twice a day have been shown to be effective.   Melatonin: Increasing evidence shows correlation between melatonin secretion and headache conditions.  Melatonin supplementation has decreased headache intensity and duration.  It is widely used as a sleep aid.  Sleep is natures way of dealing with migraine.  A dose of 3 mg is recommended to start for headaches including cluster headache. Higher doses up to 15 mg has been reviewed for use in Cluster headache and have been used. The rationale behind using melatonin for cluster is that many theories regarding the cause of Cluster headache center around the disruption of the normal circadian rhythm in the brain.  This helps restore the normal circadian rhythm.   HEADACHE DIET: Foods and beverages which may trigger migraine Note that only 20% of headache patients are food sensitive. You will know if you are food sensitive if you get a headache consistently 20 minutes to 2 hours after eating a certain food. Only cut out a food if it causes headaches, otherwise you might remove foods you enjoy! What matters most for diet is to eat a well balanced healthy diet full of vegetables and low fat protein, and to not miss meals.   Chocolate, other sweets ALL cheeses except cottage and cream cheese Dairy products, yogurt, sour cream, ice cream Liver Meat extracts (Bovril, Marmite, meat tenderizers) Meats or fish which have undergone aging, fermenting, pickling or smoking. These include: Hotdogs,salami,Lox,sausage, mortadellas,smoked salmon, pepperoni, Pickled herring Pods of broad bean (English beans, Chinese pea pods, [place] (fava) beans, lima and navy beans Ripe avocado, ripe banana Yeast extracts or active yeast preparations such as Brewer's or Fleishman's (commercial bakes goods are permitted) Tomato based foods, pizza (lasagna, etc.)   MSG (monosodium glutamate) is disguised as many things; look for these common  aliases: Monopotassium glutamate Autolysed yeast Hydrolysed protein Sodium caseinate "flavorings" "all natural preservatives" Nutrasweet   Avoid all other foods that convincingly provoke headaches.   Resources: The Dizzy [place]air Laundry Your Headache Diet, migrainestrong.com  https://zamora-andrews.com/   Caffeine and Migraine For patients that have migraine, caffeine intake more than 3 days per week can lead to dependency and increased migraine frequency. I would recommend cutting back on your caffeine intake as best you can. The recommended amount of caffeine is 200-300 mg daily, although migraine patients may experience dependency at even lower doses. While you may notice an increase in headache temporarily, cutting back will be helpful for headaches in the long run. For more information on caffeine and migraine, visit: https://americanmigrainefoundation.org/resource-library/caffeine-and-migraine/   Headache Prevention Strategies:   1. Maintain a headache diary; learn to identify and avoid triggers.  - This can be a simple note where you log when you had a headache, associated symptoms, and medications used - There are several smartphone apps developed to help track migraines: Migraine Buddy, Migraine Monitor, Curelator N1-Headache App   Common triggers include: Emotional triggers: Emotional/Upset family or friends Emotional/Upset occupation Business reversal/success Anticipation anxiety Crisis-serious Post-crisis periodNew job/position   Physical triggers: Vacation Day Weekend Strenuous Exercise High Altitude Location New Move Menstrual Day Physical Illness Oversleep/Not enough sleep Weather changes Light: Photophobia or  light sesnitivity treatment involves a balance between desensitization and reduction in overly strong input. Use dark polarized glasses outside, but not inside. Avoid bright or fluorescent light, but do not dim  environment to the point that going into a normally lit room hurts. Consider FL-41 tint lenses, which reduce the most irritating wavelengths without blocking too much light.  These can be obtained at axonoptics.com or theraspecs.com Foods: see list above.   2. Limit use of acute treatments (over-the-counter medications, triptans, etc.) to no more than 2 days per week or 10 days per month to prevent medication overuse headache (rebound headache).     3. Follow a regular schedule (including weekends and holidays): Don't skip meals. Eat a balanced diet. 8 hours of sleep nightly. Minimize stress. Exercise 30 minutes per day. Being overweight is associated with a 5 times increased risk of chronic migraine. Keep well hydrated and drink 6-8 glasses of water per day.   4. Initiate non-pharmacologic measures at the earliest onset of your headache. Rest and quiet environment. Relax and reduce stress. Breathe2Relax is a free app that can instruct you on    some simple relaxtion and breathing techniques. Http://Dawnbuse.com is a    free website that provides teaching videos on relaxation.  Also, there are  many apps that   can be downloaded for "mindful" relaxation.  An app called YOGA NIDRA will help walk you through mindfulness. Another app called Calm can be downloaded to give you a structured mindfulness guide with daily reminders and skill development. Headspace for guided meditation Mindfulness Based Stress Reduction Online Course: www.palousemindfulness.com Cold compresses.   5. Don't wait!! Take the maximum allowable dosage of prescribed medication at the first sign of migraine.   6. Compliance:  Take prescribed medication regularly as directed and at the first sign of a migraine.   7. Communicate:  Call your physician when problems arise, especially if your headaches change, increase in frequency/severity, or become associated with neurological symptoms (weakness, numbness, slurred speech, etc.).  Proceed to emergency room if you experience new or worsening symptoms or symptoms do not resolve, if you have new neurologic symptoms or if headache is severe, or for any concerning symptom.   8. Headache/pain management therapies: Consider various complementary methods, including medication, behavioral therapy, psychological counselling, biofeedback, massage therapy, acupuncture, dry needling, and other modalities.  Such measures may reduce the need for medications. Counseling for pain management, where patients learn to function and ignore/minimize their pain, seems to work very well.   9. Recommend changing family's attention and focus away from patient's headaches. Instead, emphasize daily activities. If first question of day is 'How are your headaches/Do you have a headache today?', then patient will constantly think about headaches, thus making them worse. Goal is to re-direct attention away from headaches, toward daily activities and other distractions.   10. Helpful Websites: www.AmericanHeadacheSociety.org PatentHood.ch www.headaches.org TightMarket.nl www.achenet.org

## 2023-04-28 ENCOUNTER — Other Ambulatory Visit (HOSPITAL_COMMUNITY): Payer: Self-pay

## 2023-04-28 ENCOUNTER — Telehealth: Payer: Self-pay | Admitting: Pharmacist

## 2023-04-28 ENCOUNTER — Ambulatory Visit: Payer: Commercial Managed Care - PPO | Admitting: Family Medicine

## 2023-04-28 ENCOUNTER — Encounter: Payer: Self-pay | Admitting: Family Medicine

## 2023-04-28 VITALS — BP 131/83 | Resp 17 | Ht 66.0 in | Wt 154.0 lb

## 2023-04-28 DIAGNOSIS — G43E09 Chronic migraine with aura, not intractable, without status migrainosus: Secondary | ICD-10-CM | POA: Diagnosis not present

## 2023-04-28 MED ORDER — QULIPTA 60 MG PO TABS: 60.0000 mg | ORAL_TABLET | Freq: Every day | ORAL | 3 refills | Status: AC

## 2023-04-28 NOTE — Telephone Encounter (Signed)
 Pharmacy Patient Advocate Encounter   Received notification from Patient Pharmacy that prior authorization for Qulipta 60MG  tablets is required/requested.   Insurance verification completed.   The patient is insured through [facility] .   Per test claim: PA required; PA submitted to above mentioned insurance via CoverMyMeds Key/confirmation #/EOC [id number] Status is pending

## 2023-04-29 NOTE — Telephone Encounter (Signed)
 Pharmacy Patient Advocate Encounter  Received notification from [facility] that Prior Authorization for Qulipta 60MG  tablets has been APPROVED from 04/28/2023 to 10/29/2023   PA #/Case ID/Reference #:  [id number]

## 2023-04-30 ENCOUNTER — Telehealth: Payer: Self-pay | Admitting: Family Medicine

## 2023-04-30 NOTE — Telephone Encounter (Signed)
 Amy- I see you saw pt 04/27/33 but no mention of referral to Rheumatology?

## 2023-04-30 NOTE — Telephone Encounter (Signed)
 Pt has called to provide where she would like her referral sent to for Rheumatology. [facility] [place] pt has been told they can get her in within a month.

## 2023-04-30 NOTE — Telephone Encounter (Signed)
 Called and spoke w/ pt. Relayed Amy's message. Pt verbalized understanding.

## 2023-09-12 ENCOUNTER — Other Ambulatory Visit (HOSPITAL_COMMUNITY): Payer: Self-pay

## 2023-09-30 ENCOUNTER — Other Ambulatory Visit (HOSPITAL_COMMUNITY): Payer: Self-pay

## 2023-09-30 ENCOUNTER — Telehealth: Payer: Self-pay

## 2023-09-30 NOTE — Telephone Encounter (Signed)
 It is time to renew PA for Qulipta , PT has not been evaluated since starting. Insurance is asking for documentation addressing the following questions. Please advise. Thanks.

## 2023-10-01 NOTE — Telephone Encounter (Signed)
 Called pt at [phone]. Still having daily migraines, not on any medication currently. She reports she ran out of Qulipta . Received 30 days supply of samples at last appt 04/28/23 with Amy. She never refilled after this. Unsure why. States copay was high. She is not taking Zavzpret  either. States no pharmacy was able to fill for her. She is going to call [facility] mail order to get more info about high copay cost for Qulipta . Aware PA approval on file. I encouraged her to ask if there are any financial assistance options and to call back if she needs further help. I offered to call [facility] as well but she declined. She will keep upcoming appt 11/20/23 at 3pm with Amy and discuss things further at that appointment. I encouraged her to call us  next time to let us  know if she is unable to get her medication for any reason so we can try and help. She verbalized understanding.

## 2023-10-03 ENCOUNTER — Other Ambulatory Visit (HOSPITAL_COMMUNITY): Payer: Self-pay

## 2023-10-03 NOTE — Telephone Encounter (Addendum)
   PT can get a 30DS for zero copay per test claim.   PT also should be able to get Zavzpret  for zero copay per test claim.

## 2023-10-28 ENCOUNTER — Other Ambulatory Visit (HOSPITAL_COMMUNITY): Payer: Self-pay

## 2023-10-29 ENCOUNTER — Ambulatory Visit: Admitting: Family Medicine

## 2023-11-20 ENCOUNTER — Ambulatory Visit: Admitting: Family Medicine

## 2024-01-09 ENCOUNTER — Ambulatory Visit: Admitting: Family Medicine

## 2024-01-27 ENCOUNTER — Ambulatory Visit: Admitting: Family Medicine

## 2024-04-29 ENCOUNTER — Ambulatory Visit: Admitting: Neurology
